# Patient Record
Sex: Female | Born: 1975 | Race: Black or African American | Hispanic: No | Marital: Single | Smoking: Current every day smoker
Health system: Southern US, Community
[De-identification: ages and names within clinical notes are randomized; demographics above are authoritative.]

## PROBLEM LIST (undated history)

## (undated) DIAGNOSIS — I10 Essential (primary) hypertension: Secondary | ICD-10-CM

## (undated) DIAGNOSIS — M1990 Unspecified osteoarthritis, unspecified site: Secondary | ICD-10-CM

## (undated) HISTORY — PX: BREAST SURGERY: SHX581

---

## 2009-08-01 ENCOUNTER — Other Ambulatory Visit: Admission: RE | Admit: 2009-08-01 | Discharge: 2009-08-01 | Payer: Self-pay | Admitting: Unknown Physician Specialty

## 2015-05-03 ENCOUNTER — Other Ambulatory Visit: Payer: Self-pay | Admitting: Physician Assistant

## 2015-05-10 ENCOUNTER — Encounter (HOSPITAL_COMMUNITY)
Admission: RE | Admit: 2015-05-10 | Discharge: 2015-05-10 | Disposition: A | Discharge status: Home / Self Care | Payer: PRIVATE HEALTH INSURANCE | Source: Ambulatory Visit | Attending: Orthopaedic Surgery | Admitting: Orthopaedic Surgery

## 2015-05-10 ENCOUNTER — Encounter (HOSPITAL_COMMUNITY): Payer: Self-pay

## 2015-05-10 DIAGNOSIS — I498 Other specified cardiac arrhythmias: Secondary | ICD-10-CM | POA: Insufficient documentation

## 2015-05-10 DIAGNOSIS — Z01812 Encounter for preprocedural laboratory examination: Secondary | ICD-10-CM | POA: Insufficient documentation

## 2015-05-10 DIAGNOSIS — Z01818 Encounter for other preprocedural examination: Secondary | ICD-10-CM | POA: Insufficient documentation

## 2015-05-10 HISTORY — DX: Essential (primary) hypertension: I10

## 2015-05-10 HISTORY — DX: Unspecified osteoarthritis, unspecified site: M19.90

## 2015-05-10 LAB — HCG, SERUM, QUALITATIVE: Preg, Serum: NEGATIVE

## 2015-05-10 LAB — BASIC METABOLIC PANEL
Anion gap: 13 (ref 5–15)
BUN: 7 mg/dL (ref 6–20)
CALCIUM: 9.7 mg/dL (ref 8.9–10.3)
CHLORIDE: 102 mmol/L (ref 101–111)
CO2: 23 mmol/L (ref 22–32)
CREATININE: 0.89 mg/dL (ref 0.44–1.00)
Glucose, Bld: 102 mg/dL — ABNORMAL HIGH (ref 65–99)
Potassium: 3.5 mmol/L (ref 3.5–5.1)
SODIUM: 138 mmol/L (ref 135–145)

## 2015-05-10 LAB — CBC
HCT: 43 % (ref 36.0–46.0)
HEMOGLOBIN: 14.8 g/dL (ref 12.0–15.0)
MCH: 31.7 pg (ref 26.0–34.0)
MCHC: 34.4 g/dL (ref 30.0–36.0)
MCV: 92.1 fL (ref 78.0–100.0)
PLATELETS: 216 10*3/uL (ref 150–400)
RBC: 4.67 MIL/uL (ref 3.87–5.11)
RDW: 12.9 % (ref 11.5–15.5)
WBC: 7.9 10*3/uL (ref 4.0–10.5)

## 2015-05-10 LAB — SURGICAL PCR SCREEN
MRSA, PCR: NEGATIVE
STAPHYLOCOCCUS AUREUS: NEGATIVE

## 2015-05-10 NOTE — Pre-Procedure Instructions (Addendum)
Avalin L Pasquarella  05/10/2015     No Pharmacies Listed   Your procedure is scheduled on Tuesday, May 16, 2015    [facility] "A" Admitting Office at 10:10 AM.   Call this number if you have problems the morning of surgery: [phone]   Any questions prior to day of surgery, please call [phone] between 8 & 4 PM.   Remember:  Do not eat food or drink liquids after midnight Monday, 05/15/15.  Take these medicines the morning of surgery with A SIP OF WATER:atenolol, pain med  STOP all herbel meds, nsaids (aleve,naproxen,advil,ibuprofen)TOday including aspirin, vitamins   Do not wear jewelry, make-up or nail polish.  Do not wear lotions, powders, or perfumes.  You may wear deodorant.  Do not shave 48 hours prior to surgery.    Do not bring valuables to the hospital.  [facility] is not responsible for any belongings or valuables.  Contacts, dentures or bridgework may not be worn into surgery.  Leave your suitcase in the car.  After surgery it may be brought to your room.  For patients admitted to the hospital, discharge time will be determined by your treatment team.  Special instructions:   Special Instructions: [place] - Preparing for Surgery  Before surgery, you can play an important role.  Because skin is not sterile, your skin needs to be as free of germs as possible.  You can reduce the number of germs on you skin by washing with CHG (chlorahexidine gluconate) soap before surgery.  CHG is an antiseptic cleaner which kills germs and bonds with the skin to continue killing germs even after washing.  Please DO NOT use if you have an allergy to CHG or antibacterial soaps.  If your skin becomes reddened/irritated stop using the CHG and inform your nurse when you arrive at Short Stay.  Do not shave (including legs and underarms) for at least 48 hours prior to the first CHG shower.  You may shave your face.  Please follow these instructions  carefully:   1.  Shower with CHG Soap the night before surgery and the morning of Surgery.  2.  If you choose to wash your hair, wash your hair first as usual with your normal shampoo.  3.  After you shampoo, rinse your hair and body thoroughly to remove the Shampoo.  4.  Use CHG as you would any other liquid soap.  You can apply chg directly  to the skin and wash gently with scrungie or a clean washcloth.  5.  Apply the CHG Soap to your body ONLY FROM THE NECK DOWN.  Do not use on open wounds or open sores.  Avoid contact with your eyes ears, mouth and genitals (private parts).  Wash genitals (private parts)       with your normal soap.  6.  Wash thoroughly, paying special attention to the area where your surgery will be performed.  7.  Thoroughly rinse your body with warm water from the neck down.  8.  DO NOT shower/wash with your normal soap after using and rinsing off the CHG Soap.  9.  Pat yourself dry with a clean towel.            10.  Wear clean pajamas.            11.  Place clean sheets on your bed the night of your first shower and do not sleep with pets.  Day of  Surgery  Do not apply any lotions/deodorants the morning of surgery.  Please wear clean clothes to the hospital/surgery center. Please read over the following fact sheets that you were given. Pain Booklet, Coughing and Deep Breathing, MRSA Information and Surgical Site Infection Prevention

## 2015-05-15 MED ORDER — CEFAZOLIN SODIUM-DEXTROSE 2-3 GM-% IV SOLR
2.0000 g | INTRAVENOUS | Status: AC
  Administered 2015-05-16: 2 g via INTRAVENOUS
  Filled 2015-05-15: qty 50

## 2015-05-15 MED ORDER — TRANEXAMIC ACID 1000 MG/10ML IV SOLN
1000.0000 mg | INTRAVENOUS | Status: AC
  Administered 2015-05-16: 1000 mg via INTRAVENOUS
  Filled 2015-05-15: qty 10

## 2015-05-16 ENCOUNTER — Inpatient Hospital Stay (HOSPITAL_COMMUNITY): Payer: PRIVATE HEALTH INSURANCE | Admitting: Anesthesiology

## 2015-05-16 ENCOUNTER — Encounter (HOSPITAL_COMMUNITY)
Admission: RE | Disposition: A | Discharge status: Home Health | Payer: Self-pay | Source: Ambulatory Visit | Attending: Orthopaedic Surgery

## 2015-05-16 ENCOUNTER — Inpatient Hospital Stay (HOSPITAL_COMMUNITY)
Admission: RE | Admit: 2015-05-16 | Discharge: 2015-05-18 | DRG: 470 | Disposition: A | Discharge status: Home Health | Payer: PRIVATE HEALTH INSURANCE | Source: Ambulatory Visit | Attending: Orthopaedic Surgery | Admitting: Orthopaedic Surgery

## 2015-05-16 ENCOUNTER — Inpatient Hospital Stay (HOSPITAL_COMMUNITY): Payer: PRIVATE HEALTH INSURANCE

## 2015-05-16 ENCOUNTER — Encounter (HOSPITAL_COMMUNITY): Payer: Self-pay | Admitting: Anesthesiology

## 2015-05-16 DIAGNOSIS — Z419 Encounter for procedure for purposes other than remedying health state, unspecified: Secondary | ICD-10-CM

## 2015-05-16 DIAGNOSIS — F17210 Nicotine dependence, cigarettes, uncomplicated: Secondary | ICD-10-CM | POA: Diagnosis present

## 2015-05-16 DIAGNOSIS — Z96641 Presence of right artificial hip joint: Secondary | ICD-10-CM

## 2015-05-16 DIAGNOSIS — M1611 Unilateral primary osteoarthritis, right hip: Secondary | ICD-10-CM | POA: Diagnosis present

## 2015-05-16 DIAGNOSIS — I10 Essential (primary) hypertension: Secondary | ICD-10-CM | POA: Diagnosis present

## 2015-05-16 DIAGNOSIS — M25551 Pain in right hip: Secondary | ICD-10-CM | POA: Diagnosis present

## 2015-05-16 HISTORY — PX: TOTAL HIP ARTHROPLASTY: SHX124

## 2015-05-16 SURGERY — ARTHROPLASTY, HIP, TOTAL, ANTERIOR APPROACH
Anesthesia: Spinal | Site: Hip | Laterality: Right

## 2015-05-16 MED ORDER — CEFAZOLIN SODIUM 1-5 GM-% IV SOLN
1.0000 g | Freq: Four times a day (QID) | INTRAVENOUS | Status: AC
  Administered 2015-05-16 (×2): 1 g via INTRAVENOUS
  Filled 2015-05-16 (×3): qty 50

## 2015-05-16 MED ORDER — LIDOCAINE HCL (CARDIAC) 20 MG/ML IV SOLN
INTRAVENOUS | Status: AC
  Filled 2015-05-16: qty 5

## 2015-05-16 MED ORDER — FENTANYL CITRATE (PF) 100 MCG/2ML IJ SOLN
INTRAMUSCULAR | Status: DC | PRN
  Administered 2015-05-16: 25 ug via INTRAVENOUS
  Administered 2015-05-16 (×2): 50 ug via INTRAVENOUS
  Administered 2015-05-16: 25 ug via INTRAVENOUS

## 2015-05-16 MED ORDER — METHOCARBAMOL 1000 MG/10ML IJ SOLN
500.0000 mg | Freq: Four times a day (QID) | INTRAMUSCULAR | Status: DC | PRN
  Filled 2015-05-16: qty 5

## 2015-05-16 MED ORDER — CHLORHEXIDINE GLUCONATE 4 % EX LIQD: 60.0000 mL | Freq: Once | CUTANEOUS | Status: DC

## 2015-05-16 MED ORDER — ALUM & MAG HYDROXIDE-SIMETH 200-200-20 MG/5ML PO SUSP: 30.0000 mL | ORAL | Status: DC | PRN

## 2015-05-16 MED ORDER — PHENOL 1.4 % MT LIQD: 1.0000 | OROMUCOSAL | Status: DC | PRN

## 2015-05-16 MED ORDER — HYDROMORPHONE HCL 1 MG/ML IJ SOLN
1.0000 mg | INTRAMUSCULAR | Status: DC | PRN
  Administered 2015-05-16 – 2015-05-18 (×8): 1 mg via INTRAVENOUS
  Filled 2015-05-16 (×8): qty 1

## 2015-05-16 MED ORDER — MIDAZOLAM HCL 2 MG/2ML IJ SOLN
INTRAMUSCULAR | Status: AC
  Filled 2015-05-16: qty 2

## 2015-05-16 MED ORDER — KETOROLAC TROMETHAMINE 15 MG/ML IJ SOLN
7.5000 mg | Freq: Four times a day (QID) | INTRAMUSCULAR | Status: AC
  Administered 2015-05-16 – 2015-05-17 (×4): 7.5 mg via INTRAVENOUS
  Filled 2015-05-16 (×3): qty 1

## 2015-05-16 MED ORDER — METHOCARBAMOL 500 MG PO TABS
ORAL_TABLET | ORAL | Status: AC
  Filled 2015-05-16: qty 1

## 2015-05-16 MED ORDER — KETOROLAC TROMETHAMINE 15 MG/ML IJ SOLN
INTRAMUSCULAR | Status: AC
  Filled 2015-05-16: qty 1

## 2015-05-16 MED ORDER — OXYCODONE HCL 5 MG PO TABS
ORAL_TABLET | ORAL | Status: AC
  Filled 2015-05-16: qty 2

## 2015-05-16 MED ORDER — MEPERIDINE HCL 25 MG/ML IJ SOLN: 6.2500 mg | INTRAMUSCULAR | Status: DC | PRN

## 2015-05-16 MED ORDER — SODIUM CHLORIDE 0.9 % IV SOLN
INTRAVENOUS | Status: DC
  Administered 2015-05-16 – 2015-05-18 (×3): via INTRAVENOUS

## 2015-05-16 MED ORDER — LIDOCAINE HCL (CARDIAC) 20 MG/ML IV SOLN
INTRAVENOUS | Status: DC | PRN
  Administered 2015-05-16: 50 mg via INTRAVENOUS

## 2015-05-16 MED ORDER — NICOTINE 21 MG/24HR TD PT24
21.0000 mg | MEDICATED_PATCH | Freq: Every day | TRANSDERMAL | Status: DC
  Administered 2015-05-16 – 2015-05-17 (×2): 21 mg via TRANSDERMAL
  Filled 2015-05-16 (×3): qty 1

## 2015-05-16 MED ORDER — ONDANSETRON HCL 4 MG/2ML IJ SOLN
INTRAMUSCULAR | Status: DC | PRN
  Administered 2015-05-16: 4 mg via INTRAVENOUS

## 2015-05-16 MED ORDER — 0.9 % SODIUM CHLORIDE (POUR BTL) OPTIME
TOPICAL | Status: DC | PRN
Start: 2015-05-16 — End: 2015-05-16
  Administered 2015-05-16: 1000 mL

## 2015-05-16 MED ORDER — HYDROMORPHONE HCL 1 MG/ML IJ SOLN
INTRAMUSCULAR | Status: AC
  Filled 2015-05-16: qty 1

## 2015-05-16 MED ORDER — ASPIRIN EC 325 MG PO TBEC
325.0000 mg | DELAYED_RELEASE_TABLET | Freq: Two times a day (BID) | ORAL | Status: DC
  Administered 2015-05-16 – 2015-05-18 (×4): 325 mg via ORAL
  Filled 2015-05-16 (×4): qty 1

## 2015-05-16 MED ORDER — METOCLOPRAMIDE HCL 10 MG PO TABS: 5.0000 mg | ORAL_TABLET | Freq: Three times a day (TID) | ORAL | Status: DC | PRN

## 2015-05-16 MED ORDER — PROMETHAZINE HCL 25 MG/ML IJ SOLN: 6.2500 mg | INTRAMUSCULAR | Status: DC | PRN

## 2015-05-16 MED ORDER — METOCLOPRAMIDE HCL 5 MG/ML IJ SOLN
5.0000 mg | Freq: Three times a day (TID) | INTRAMUSCULAR | Status: DC | PRN
Start: 2015-05-16 — End: 2015-05-18

## 2015-05-16 MED ORDER — PNEUMOCOCCAL VAC POLYVALENT 25 MCG/0.5ML IJ INJ
0.5000 mL | INJECTION | INTRAMUSCULAR | Status: AC
  Administered 2015-05-17: 0.5 mL via INTRAMUSCULAR
  Filled 2015-05-16: qty 1
  Filled 2015-05-16: qty 0.5

## 2015-05-16 MED ORDER — OXYCODONE HCL 5 MG PO TABS
5.0000 mg | ORAL_TABLET | ORAL | Status: DC | PRN
  Administered 2015-05-16: 5 mg via ORAL
  Administered 2015-05-16 – 2015-05-17 (×2): 10 mg via ORAL
  Administered 2015-05-17: 5 mg via ORAL
  Administered 2015-05-17 – 2015-05-18 (×2): 10 mg via ORAL
  Filled 2015-05-16: qty 1
  Filled 2015-05-16: qty 2
  Filled 2015-05-16: qty 1
  Filled 2015-05-16 (×2): qty 2

## 2015-05-16 MED ORDER — ONDANSETRON HCL 4 MG/2ML IJ SOLN
4.0000 mg | Freq: Four times a day (QID) | INTRAMUSCULAR | Status: DC | PRN
  Administered 2015-05-16 – 2015-05-17 (×4): 4 mg via INTRAVENOUS
  Filled 2015-05-16 (×4): qty 2

## 2015-05-16 MED ORDER — MIDAZOLAM HCL 5 MG/5ML IJ SOLN
INTRAMUSCULAR | Status: DC | PRN
  Administered 2015-05-16 (×2): 1 mg via INTRAVENOUS

## 2015-05-16 MED ORDER — ATENOLOL 50 MG PO TABS
25.0000 mg | ORAL_TABLET | Freq: Every day | ORAL | Status: DC
  Administered 2015-05-17 – 2015-05-18 (×2): 25 mg via ORAL
  Filled 2015-05-16 (×2): qty 1

## 2015-05-16 MED ORDER — ACETAMINOPHEN 650 MG RE SUPP: 650.0000 mg | Freq: Four times a day (QID) | RECTAL | Status: DC | PRN

## 2015-05-16 MED ORDER — HYDROMORPHONE HCL 1 MG/ML IJ SOLN
0.2500 mg | INTRAMUSCULAR | Status: DC | PRN
  Administered 2015-05-16 (×2): 0.5 mg via INTRAVENOUS

## 2015-05-16 MED ORDER — BUPIVACAINE HCL (PF) 0.5 % IJ SOLN
INTRAMUSCULAR | Status: DC | PRN
  Administered 2015-05-16: 3 mL via INTRATHECAL

## 2015-05-16 MED ORDER — ACETAMINOPHEN 325 MG PO TABS: 650.0000 mg | ORAL_TABLET | Freq: Four times a day (QID) | ORAL | Status: DC | PRN

## 2015-05-16 MED ORDER — METHOCARBAMOL 500 MG PO TABS
500.0000 mg | ORAL_TABLET | Freq: Four times a day (QID) | ORAL | Status: DC | PRN
  Administered 2015-05-16 – 2015-05-17 (×2): 500 mg via ORAL
  Filled 2015-05-16: qty 1

## 2015-05-16 MED ORDER — LACTATED RINGERS IV SOLN
INTRAVENOUS | Status: DC
  Administered 2015-05-16 (×2): via INTRAVENOUS

## 2015-05-16 MED ORDER — ONDANSETRON HCL 4 MG PO TABS: 4.0000 mg | ORAL_TABLET | Freq: Four times a day (QID) | ORAL | Status: DC | PRN

## 2015-05-16 MED ORDER — FENTANYL CITRATE (PF) 250 MCG/5ML IJ SOLN
INTRAMUSCULAR | Status: AC
  Filled 2015-05-16: qty 5

## 2015-05-16 MED ORDER — INFLUENZA VAC SPLIT QUAD 0.5 ML IM SUSY
0.5000 mL | PREFILLED_SYRINGE | INTRAMUSCULAR | Status: AC
  Administered 2015-05-17: 0.5 mL via INTRAMUSCULAR
  Filled 2015-05-16: qty 0.5

## 2015-05-16 MED ORDER — DIPHENHYDRAMINE HCL 12.5 MG/5ML PO ELIX
12.5000 mg | ORAL_SOLUTION | ORAL | Status: DC | PRN
  Filled 2015-05-16: qty 10

## 2015-05-16 MED ORDER — MENTHOL 3 MG MT LOZG: 1.0000 | LOZENGE | OROMUCOSAL | Status: DC | PRN

## 2015-05-16 MED ORDER — ONDANSETRON HCL 4 MG/2ML IJ SOLN
INTRAMUSCULAR | Status: AC
  Filled 2015-05-16: qty 2

## 2015-05-16 MED ORDER — PROPOFOL 500 MG/50ML IV EMUL
INTRAVENOUS | Status: DC | PRN
  Administered 2015-05-16: 50 ug/kg/min via INTRAVENOUS

## 2015-05-16 MED ORDER — ZOLPIDEM TARTRATE 5 MG PO TABS: 5.0000 mg | ORAL_TABLET | Freq: Every evening | ORAL | Status: DC | PRN

## 2015-05-16 MED ORDER — HYDROCHLOROTHIAZIDE 25 MG PO TABS
25.0000 mg | ORAL_TABLET | Freq: Every day | ORAL | Status: DC
  Administered 2015-05-16 – 2015-05-18 (×3): 25 mg via ORAL
  Filled 2015-05-16 (×3): qty 1

## 2015-05-16 MED ORDER — DOCUSATE SODIUM 100 MG PO CAPS
100.0000 mg | ORAL_CAPSULE | Freq: Two times a day (BID) | ORAL | Status: DC
  Administered 2015-05-16 – 2015-05-18 (×4): 100 mg via ORAL
  Filled 2015-05-16 (×5): qty 1

## 2015-05-16 SURGICAL SUPPLY — 52 items
BENZOIN TINCTURE PRP APPL 2/3 (GAUZE/BANDAGES/DRESSINGS) ×3 IMPLANT
BLADE SAW SGTL 18X1.27X75 (BLADE) ×2 IMPLANT
BLADE SAW SGTL 18X1.27X75MM (BLADE) ×1
BLADE SURG ROTATE 9660 (MISCELLANEOUS) IMPLANT
CAPT HIP TOTAL 2 ×3 IMPLANT
CELLS DAT CNTRL 66122 CELL SVR (MISCELLANEOUS) ×1 IMPLANT
CLOSURE STERI-STRIP 1/2X4 (GAUZE/BANDAGES/DRESSINGS) ×1
CLOSURE WOUND 1/2 X4 (GAUZE/BANDAGES/DRESSINGS) ×2
CLSR STERI-STRIP ANTIMIC 1/2X4 (GAUZE/BANDAGES/DRESSINGS) ×2 IMPLANT
COVER SURGICAL LIGHT HANDLE (MISCELLANEOUS) ×3 IMPLANT
DRAPE C-ARM 42X72 X-RAY (DRAPES) ×3 IMPLANT
DRAPE STERI IOBAN 125X83 (DRAPES) ×3 IMPLANT
DRAPE U-SHAPE 47X51 STRL (DRAPES) ×9 IMPLANT
DRSG AQUACEL AG ADV 3.5X10 (GAUZE/BANDAGES/DRESSINGS) ×3 IMPLANT
DURAPREP 26ML APPLICATOR (WOUND CARE) ×3 IMPLANT
ELECT BLADE 4.0 EZ CLEAN MEGAD (MISCELLANEOUS) ×3
ELECT BLADE 6.5 EXT (BLADE) IMPLANT
ELECT REM PT RETURN 9FT ADLT (ELECTROSURGICAL) ×3
ELECTRODE BLDE 4.0 EZ CLN MEGD (MISCELLANEOUS) ×1 IMPLANT
ELECTRODE REM PT RTRN 9FT ADLT (ELECTROSURGICAL) ×1 IMPLANT
FACESHIELD WRAPAROUND (MASK) ×6 IMPLANT
GLOVE BIOGEL PI IND STRL 8 (GLOVE) ×2 IMPLANT
GLOVE BIOGEL PI INDICATOR 8 (GLOVE) ×4
GLOVE ECLIPSE 8.0 STRL XLNG CF (GLOVE) ×3 IMPLANT
GLOVE ORTHO TXT STRL SZ7.5 (GLOVE) ×6 IMPLANT
GOWN STRL REUS W/ TWL LRG LVL3 (GOWN DISPOSABLE) ×2 IMPLANT
GOWN STRL REUS W/ TWL XL LVL3 (GOWN DISPOSABLE) ×2 IMPLANT
GOWN STRL REUS W/TWL LRG LVL3 (GOWN DISPOSABLE) ×4
GOWN STRL REUS W/TWL XL LVL3 (GOWN DISPOSABLE) ×4
HANDPIECE INTERPULSE COAX TIP (DISPOSABLE) ×2
KIT BASIN OR (CUSTOM PROCEDURE TRAY) ×3 IMPLANT
KIT ROOM TURNOVER OR (KITS) ×3 IMPLANT
MANIFOLD NEPTUNE II (INSTRUMENTS) ×3 IMPLANT
NS IRRIG 1000ML POUR BTL (IV SOLUTION) ×3 IMPLANT
PACK TOTAL JOINT (CUSTOM PROCEDURE TRAY) ×3 IMPLANT
PAD ARMBOARD 7.5X6 YLW CONV (MISCELLANEOUS) ×3 IMPLANT
RTRCTR WOUND ALEXIS 18CM MED (MISCELLANEOUS) ×3
SET HNDPC FAN SPRY TIP SCT (DISPOSABLE) ×1 IMPLANT
STAPLER VISISTAT 35W (STAPLE) IMPLANT
STRIP CLOSURE SKIN 1/2X4 (GAUZE/BANDAGES/DRESSINGS) ×4 IMPLANT
SUT ETHIBOND NAB CT1 #1 30IN (SUTURE) ×3 IMPLANT
SUT MNCRL AB 4-0 PS2 18 (SUTURE) IMPLANT
SUT VIC AB 0 CT1 27 (SUTURE) ×2
SUT VIC AB 0 CT1 27XBRD ANBCTR (SUTURE) ×1 IMPLANT
SUT VIC AB 1 CT1 27 (SUTURE) ×2
SUT VIC AB 1 CT1 27XBRD ANBCTR (SUTURE) ×1 IMPLANT
SUT VIC AB 2-0 CT1 27 (SUTURE) ×2
SUT VIC AB 2-0 CT1 TAPERPNT 27 (SUTURE) ×1 IMPLANT
TOWEL OR 17X24 6PK STRL BLUE (TOWEL DISPOSABLE) ×3 IMPLANT
TOWEL OR 17X26 10 PK STRL BLUE (TOWEL DISPOSABLE) ×3 IMPLANT
TRAY FOLEY CATH 16FRSI W/METER (SET/KITS/TRAYS/PACK) IMPLANT
WATER STERILE IRR 1000ML POUR (IV SOLUTION) ×6 IMPLANT

## 2015-05-16 NOTE — Anesthesia Postprocedure Evaluation (Signed)
Anesthesia Post Note  Patient: [name]  Procedure(s) Performed: Procedure(s) (LRB): RIGHT TOTAL HIP ARTHROPLASTY ANTERIOR APPROACH (Right)  Patient location during evaluation: PACU Anesthesia Type: Spinal and MAC Level of consciousness: awake and alert Pain management: pain level controlled Vital Signs Assessment: post-procedure vital signs reviewed and stable Respiratory status: spontaneous breathing and respiratory function stable Cardiovascular status: blood pressure returned to baseline and stable Postop Assessment: spinal receding Anesthetic complications: no    Last Vitals:  Filed Vitals:   05/16/15 1415 05/16/15 1445  BP: 120/79 123/80  Pulse: 61 67  Temp:    Resp: 13 17    Last Pain:  Filed Vitals:   05/16/15 1454  PainSc: 4                  Elene Downum [employer]

## 2015-05-16 NOTE — Transfer of Care (Signed)
Immediate Anesthesia Transfer of Care Note  Patient: [name]  Procedure(s) Performed: Procedure(s): RIGHT TOTAL HIP ARTHROPLASTY ANTERIOR APPROACH (Right)  Patient Location: PACU  Anesthesia Type:Spinal  Level of Consciousness: awake, alert , oriented and patient cooperative  Airway & Oxygen Therapy: Patient Spontanous Breathing and Patient connected to nasal cannula oxygen  Post-op Assessment: Report given to RN and Post -op Vital signs reviewed and stable  Post vital signs: Reviewed and stable  Last Vitals:  Filed Vitals:   05/16/15 0839  BP: 125/81  Pulse: 80  Temp: 36.7 C  Resp: 20    Complications: No apparent anesthesia complications

## 2015-05-16 NOTE — Progress Notes (Signed)
NURSING PROGRESS NOTE  Courtney CabotShuwana L Silva [id number] Admission Data: 05/16/2015 4:57 PM Attending Provider: Kathryne Hitchhristopher Y Blackman, * WUJ:WJXB,JYNWGNFAPCP:MUSE,ROCHELLE D., PA-C Code Status: FULL  [name] is a 40 y.o. female patient admitted from PACU:  -No acute distress noted.  -No complaints of shortness of breath.  -No complaints of chest pain.   Cardiac Monitoring: N/A  Blood pressure 130/79, pulse 73, temperature 97.5 F (36.4 C), temperature source Oral, resp. rate 20, height 5\' 3"  (1.6 m), weight 87.544 kg (193 lb), last menstrual period 04/18/2015, SpO2 95 %.   IV Fluids:  IV in place, occlusive dsg intact without redness, IV cath hand left, condition patent and no redness normal saline.   Allergies:  Review of patient's allergies indicates no known allergies.  Past Medical History:   has a past medical history of Hypertension and Arthritis.  Past Surgical History:   has past surgical history that includes Breast surgery (2014,2009).  Social History:   reports that she has been smoking Cigarettes.  She has a 10 pack-year smoking history. She does not have any smokeless tobacco history on file. She reports that she does not drink alcohol or use illicit drugs.  Skin: Surgical incision to right hip is clean, dry & intact.   Patient/Family orientated to room. Information packet given to patient/family. Admission inpatient armband information verified with patient/family to include name and date of birth and placed on patient arm. Side rails up x 2, fall assessment and education completed with patient/family. Patient/family able to verbalize understanding of risk associated with falls and verbalized understanding to call for assistance before getting out of bed. Call light within reach. Patient/family able to voice and demonstrate understanding of unit orientation instructions.    Will continue to evaluate and treat per MD orders.  Bennie Pieriniyndi Christalynn Boise, RN

## 2015-05-16 NOTE — Anesthesia Procedure Notes (Signed)
Spinal Patient location during procedure: OR Staffing Anesthesiologist: Alexander Mcauley Performed by: anesthesiologist  Preanesthetic Checklist Completed: patient identified, site marked, surgical consent, pre-op evaluation, timeout performed, IV checked, risks and benefits discussed and monitors and equipment checked Spinal Block Patient position: sitting Prep: ChloraPrep Patient monitoring: heart rate, continuous pulse ox and blood pressure Approach: midline Location: L2-3 Injection technique: single-shot Needle Needle type: Sprotte  Needle gauge: 24 G Needle length: 9 cm Additional Notes Expiration date of kit checked and confirmed. Patient tolerated procedure well, without complications.     

## 2015-05-16 NOTE — Progress Notes (Signed)
Lunch relief by M. Brande RN 

## 2015-05-16 NOTE — Anesthesia Preprocedure Evaluation (Addendum)
Anesthesia Evaluation  Patient identified by MRN, date of birth, ID band Patient awake    Reviewed: Allergy & Precautions, NPO status , Patient's Chart, lab work & pertinent test results  Airway Mallampati: II  TM Distance: >3 FB Neck ROM: Full    Dental no notable dental hx.    Pulmonary Current Smoker,    Pulmonary exam normal breath sounds clear to auscultation       Cardiovascular hypertension, Pt. on medications Normal cardiovascular exam Rhythm:Regular Rate:Normal     Neuro/Psych negative neurological ROS  negative psych ROS   GI/Hepatic negative GI ROS, Neg liver ROS,   Endo/Other  negative endocrine ROS  Renal/GU negative Renal ROS     Musculoskeletal  (+) Arthritis ,   Abdominal   Peds  Hematology negative hematology ROS (+)   Anesthesia Other Findings   Reproductive/Obstetrics negative OB ROS                           Anesthesia Physical Anesthesia Plan  ASA: II  Anesthesia Plan: Spinal   Post-op Pain Management:    Induction: Intravenous  Airway Management Planned:   Additional Equipment:   Intra-op Plan:   Post-operative Plan:   Informed Consent: I have reviewed the patients History and Physical, chart, labs and discussed the procedure including the risks, benefits and alternatives for the proposed anesthesia with the patient or authorized representative who has indicated his/her understanding and acceptance.   Dental advisory given  Plan Discussed with: CRNA  Anesthesia Plan Comments:        Anesthesia Quick Evaluation

## 2015-05-16 NOTE — Brief Op Note (Signed)
05/16/2015  12:14 PM  PATIENT:  [name]  40 y.o. 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   Delay start of Pharmacological VTE agent (>24hrs) due to surgical blood loss or risk of bleeding: no

## 2015-05-16 NOTE — H&P (Signed)
TOTAL HIP ADMISSION H&P  Patient is admitted for left total hip arthroplasty.  Subjective:  Chief Complaint: left hip pain  HPI: Franki CabotShuwana L Diesing, 40 y.o. female, has a history of pain and functional disability in the left hip(s) due to trauma and arthritis and patient has failed non-surgical conservative treatments for greater than 12 weeks to include NSAID's and/or analgesics, use of assistive devices and activity modification.  Onset of symptoms was gradual starting 7 years ago with gradually worsening course since that time.The patient noted no past surgery on the right hip(s).  Patient currently rates pain in the right hip at 10 out of 10 with activity. Patient has night pain, worsening of pain with activity and weight bearing, trendelenberg gait, pain that interfers with activities of daily living, pain with passive range of motion and crepitus. Patient has evidence of subchondral sclerosis, periarticular osteophytes and joint space narrowing by imaging studies. This condition presents safety issues increasing the risk of falls.  There is no current active infection.  Patient Active Problem List   Diagnosis Date Noted  . Osteoarthritis of right hip 05/16/2015   Past Medical History  Diagnosis Date  . Hypertension   . Arthritis     Past Surgical History  Procedure Laterality Date  . Breast surgery  2014,2009    rt breast abscess(s)    No prescriptions prior to admission   Not on File  Social History  Substance Use Topics  . Smoking status: Current Every Day Smoker -- 0.50 packs/day for 20 years    Types: Cigarettes  . Smokeless tobacco: Not on file  . Alcohol Use: No    No family history on file.   Review of Systems  Musculoskeletal: Positive for joint pain.  All other systems reviewed and are negative.   Objective:  Physical Exam  Constitutional: She is oriented to person, place, and time. She appears well-developed and well-nourished.  HENT:  Head: Normocephalic  and atraumatic.  Eyes: EOM are normal. Pupils are equal, round, and reactive to light.  Neck: Normal range of motion. Neck supple.  Cardiovascular: Normal rate and regular rhythm.   Respiratory: Effort normal and breath sounds normal.  GI: Soft. Bowel sounds are normal.  Musculoskeletal:       Right hip: She exhibits decreased range of motion, decreased strength, tenderness and bony tenderness.  Neurological: She is alert and oriented to person, place, and time.  Skin: Skin is warm and dry.  Psychiatric: She has a normal mood and affect.    Vital signs in last 24 hours:    Labs:   There is no height or weight on file to calculate BMI.   Imaging Review Plain radiographs demonstrate severe degenerative joint disease of the right hip(s). The bone quality appears to be excellent for age and reported activity level.  Assessment/Plan:  End stage arthritis, right hip(s)  The patient history, physical examination, clinical judgement of the provider and imaging studies are consistent with end stage degenerative joint disease of the right hip(s) and total hip arthroplasty is deemed medically necessary. The treatment options including medical management, injection therapy, arthroscopy and arthroplasty were discussed at length. The risks and benefits of total hip arthroplasty were presented and reviewed. The risks due to aseptic loosening, infection, stiffness, dislocation/subluxation,  thromboembolic complications and other imponderables were discussed.  The patient acknowledged the explanation, agreed to proceed with the plan and consent was signed. Patient is being admitted for inpatient treatment for surgery, pain control, PT, OT, prophylactic  antibiotics, VTE prophylaxis, progressive ambulation and ADL's and discharge planning.The patient is planning to be discharged home with home health services

## 2015-05-17 ENCOUNTER — Encounter (HOSPITAL_COMMUNITY): Payer: Self-pay | Admitting: Orthopaedic Surgery

## 2015-05-17 LAB — BASIC METABOLIC PANEL
Anion gap: 9 (ref 5–15)
CHLORIDE: 100 mmol/L — AB (ref 101–111)
CO2: 25 mmol/L (ref 22–32)
CREATININE: 0.68 mg/dL (ref 0.44–1.00)
Calcium: 8.3 mg/dL — ABNORMAL LOW (ref 8.9–10.3)
GFR calc Af Amer: >60 mL/min (ref >60)
GFR calc non Af Amer: >60 mL/min (ref >60)
Glucose, Bld: 116 mg/dL — ABNORMAL HIGH (ref 65–99)
Potassium: 2.7 mmol/L — CL (ref 3.5–5.1)
SODIUM: 134 mmol/L — AB (ref 135–145)

## 2015-05-17 LAB — CBC
HCT: 33.9 % — ABNORMAL LOW (ref 36.0–46.0)
HEMOGLOBIN: 11.8 g/dL — AB (ref 12.0–15.0)
MCH: 31.5 pg (ref 26.0–34.0)
MCHC: 34.8 g/dL (ref 30.0–36.0)
MCV: 90.4 fL (ref 78.0–100.0)
Platelets: 173 10*3/uL (ref 150–400)
RBC: 3.75 MIL/uL — ABNORMAL LOW (ref 3.87–5.11)
RDW: 12.7 % (ref 11.5–15.5)
WBC: 6.5 10*3/uL (ref 4.0–10.5)

## 2015-05-17 MED ORDER — POTASSIUM CHLORIDE CRYS ER 20 MEQ PO TBCR
40.0000 meq | EXTENDED_RELEASE_TABLET | Freq: Two times a day (BID) | ORAL | Status: AC
  Administered 2015-05-17 (×2): 40 meq via ORAL
  Filled 2015-05-17 (×2): qty 2

## 2015-05-17 NOTE — Evaluation (Signed)
Physical Therapy Evaluation Patient Details Name: Courtney CabotShuwana L Armstead MRN: [id number] DOB: 12/09/1975 Today's Date: 05/17/2015   History of Present Illness  Aneka L Glasco, 40 y.o. female, has a history of pain and functional disability in the right hip. Pt s/p Right THA. PMH: HTN, arthritis   Clinical Impression  Pt was given a hand out of exercises and started some instructions, with pt noting significant edema R thigh.  Pt is aware of safety and is high level enough to plan for home but needs to be encouraged not to overdo her mobility and exercises.  Very motivated but may not be aware of her need to set limits.  Family in to observe first session and will need to take pt on stairs next visit if possible for home, hopefully with family then.    Follow Up Recommendations Home health PT;Supervision - Intermittent    Equipment Recommendations  Rolling walker with 5" wheels    Recommendations for Other Services       Precautions / Restrictions Precautions Precautions: Fall Restrictions Weight Bearing Restrictions: No RLE Weight Bearing: Weight bearing as tolerated      Mobility  Bed Mobility Overal bed mobility: Needs Assistance Bed Mobility: Supine to Sit     Supine to sit: Min guard;Supervision     General bed mobility comments: Pt able to manage RLE independently. Supervision for safety.   Transfers Overall transfer level: Needs assistance Equipment used: Rolling walker (2 wheeled) Transfers: Sit to/from [employer];Stand Pivot Transfers Sit to Stand: Min guard Stand pivot transfers: Min guard          Ambulation/Gait Ambulation/Gait assistance: Min guard Ambulation Distance (Feet): 120 Feet Assistive device: Rolling walker (2 wheeled) Gait Pattern/deviations: Step-through pattern;Decreased stance time - right;Narrow base of support;Antalgic Gait velocity: redcued Gait velocity interpretation: Below normal speed for age/gender General Gait Details: Pt used RW  effectively to maintain controlled pressure and verbally cued her for determining how close to step to walker with gait  Stairs            Wheelchair Mobility    Modified Rankin (Stroke Patients Only)       Balance Overall balance assessment: Needs assistance Sitting-balance support: Feet supported Sitting balance-Leahy Scale: Good     Standing balance support: Bilateral upper extremity supported Standing balance-Leahy Scale: Fair                               Pertinent Vitals/Pain Pain Assessment: Faces Pain Score: 4  Faces Pain Scale: Hurts little more Pain Location: R hip Pain Descriptors / Indicators: Aching;Operative site guarding Pain Intervention(s): Monitored during session;Premedicated before session;Repositioned;Ice applied    Home Living Family/patient expects to be discharged to:: Private residence Living Arrangements: Other relatives Available Help at Discharge: Family;Available 24 hours/day Type of Home: Mobile home Home Access: Stairs to enter Entrance Stairs-Rails: Right;Left;Can reach both Entrance Stairs-Number of Steps: 2 Home Layout: One level Home Equipment: None Additional Comments: aunt and cousin to assist and were in attendance to her PT visit    Prior Function Level of Independence: Independent               Hand Dominance   Dominant Hand: Right    Extremity/Trunk Assessment   Upper Extremity Assessment: Overall WFL for tasks assessed           Lower Extremity Assessment: RLE deficits/detail RLE Deficits / Details: edema and abraded skin R thigh, weakness and pain  with movement    Cervical / Trunk Assessment: Normal  Communication   Communication: No difficulties  Cognition Arousal/Alertness: Awake/alert Behavior During Therapy: WFL for tasks assessed/performed Overall Cognitive Status: Within Functional Limits for tasks assessed                      General Comments General comments (skin  integrity, edema, etc.): Pt is able to get up to walk with minor help from her bed and with RW was safely able to maneuver with vc's for sequence and balance on walker    Exercises Total Joint Exercises Ankle Circles/Pumps: AROM;Both;5 reps Quad Sets: AROM;Both;10 reps Gluteal Sets: AROM;Both;10 reps Heel Slides: AROM;Both;10 reps Hip ABduction/ADduction: AROM;Both;10 reps      Assessment/Plan    PT Assessment Patient needs continued PT services  PT Diagnosis Difficulty walking;Acute pain   PT Problem List Decreased strength;Decreased range of motion;Decreased activity tolerance;Decreased balance;Decreased mobility;Decreased coordination;Decreased knowledge of use of DME;Decreased knowledge of precautions;Decreased skin integrity;Pain  PT Treatment Interventions DME instruction;Gait training;Stair training;Functional mobility training;Therapeutic activities;Therapeutic exercise;Balance training;Neuromuscular re-education;Patient/family education   PT Goals (Current goals can be found in the Care Plan section) Acute Rehab PT Goals Patient Stated Goal: go home PT Goal Formulation: With patient/family Time For Goal Achievement: 05/31/15 Potential to Achieve Goals: Good    Frequency 7X/week   Barriers to discharge Inaccessible home environment will need to practice stairs    Co-evaluation               End of Session Equipment Utilized During Treatment: Gait belt Activity Tolerance: Patient tolerated treatment well;Patient limited by fatigue Patient left: with call bell/phone within reach;in chair;with family/visitor present Nurse Communication: Mobility status         Time: 1610-9604 PT Time Calculation (min) (ACUTE ONLY): 27 min   Charges:   PT Evaluation $PT Eval Low Complexity: 1 Procedure PT Treatments $Gait Training: 8-22 mins   PT G CodesIvar Drape 06/06/15, 7:36 PM   Samul Dada, PT MS Acute Rehab Dept. Number: [facility] [id number] and [facility]  [phone]

## 2015-05-17 NOTE — Progress Notes (Signed)
CRITICAL VALUE ALERT  Critical value received:  Potassium 2.7  Date of notification:  05/17/15  Time of notification:  0750  Critical value read back:Yes.    Nurse who received alert:  Bennie Pieriniyndi Anjoli Diemer, RN  MD notified (1st page):  Dr Maureen Ralphs. Blackman  Time of first page:  [phone]  MD notified (2nd page):  Time of second page:  Responding MD:  Dr Maureen Ralphs Blackman  Time MD responded:  [phone]  Dr Maureen Ralphs Blackman ordered K Dur 40MEQ BID today only. Placing VO now. Will administer as soon as available.

## 2015-05-17 NOTE — Op Note (Signed)
NAMArcelia Silva:  [name], [name]             ACCOUNT NO.:  [id number]  MEDICAL RECORD NO.:  [id number]  LOCATION:  [id number]                        FACILITY:  [facility]  PHYSICIAN:  Vanita PandaChristopher Y. Magnus IvanBlackman, M.D.DATE OF BIRTH:  1975-10-24  DATE OF PROCEDURE:  05/16/2015 DATE OF DISCHARGE:                              OPERATIVE REPORT   PREOPERATIVE DIAGNOSIS:  Posttraumatic osteoarthritis and degenerative joint disease, right hip.  POSTOPERATIVE DIAGNOSIS:  Posttraumatic osteoarthritis and degenerative joint disease, right hip.  PROCEDURE:  Right total hip arthroplasty through direct anterior approach.  IMPLANTS:  DePuy Sector Gription acetabular component size 52, size 36+ 0 neutral polyethylene liner, size 11 Corail femoral component with standard offset, size 36+ 1.5 ceramic hip ball.  SURGEON:  Vanita PandaChristopher Y. Magnus IvanBlackman, M.D.  ASSISTANT:  Richardean CanalGilbert Clark, PA-C.  ANESTHESIA:  Spinal.  ANTIBIOTICS:  2 g IV Ancef.  BLOOD LOSS:  150-200 mL.  COMPLICATIONS:  None.  INDICATIONS:  Ms. [name] is a 40 year old female, who 10 years ago, sustained a right hip dislocation and a trauma.  This was reduced, and over time, she has developed severe posttraumatic arthritis of that right hip, her right leg is now shortened as well significantly, her pain is daily, it is detrimental effect to her activities of daily living, her mobility, and her quality of life.  She was sent to me from vocational rehab, and she is a very motivated individual to get back to work and contributing to society, but her hip pain is severely debilitating and limited all the things that she can do.  She desires a right total hip arthroplasty to allow her to get back to a more normal life.  She understands our goals are decreased pain, improved mobility, and overall improved quality of life.  She understands the risk of acute blood loss anemia, nerve and vessel injury, fracture, infection, dislocation, DVT, and osteolysis due to  her young age with the components eventually wearing out of her long period of time.  PROCEDURE DESCRIPTION:  After informed consent was obtained, appropriate right hip was marked.  She was brought to the operating room where spinal anesthesia was obtained while she was on her stretcher.  She was then laid in supine position on the stretcher.  Traction boots were placed on both her feet.  Next, she was placed supine on the [facility] with the perineal post in place and both legs in inline with skeletal traction devices, but no traction applied.  Her right operative hip was then prepped and draped with DuraPrep and sterile drapes.  Of note, her leg was significantly short preoperative.  After prepping with DuraPrep and sterile drapes, time-out was called to identify correct patient, correct right hip.  We then made incision inferior and posterior to the anterior-superior iliac spine and carried this obliquely down the leg.  We dissected down the tensor fascia lata muscle and tensor fascia was then divided longitudinally to proceed with a direct anterior approach to the hip.  We identified and cauterized the lateral femoral circumflex vessels and then identified the hip capsule. I opened up the hip capsule and found significant osteoarthritis of the femoral head and neck.  We used oscillating saw to cut our  femoral neck proximal to the lesser trochanter and completed this on osteotome and placed a corkscrew guide in the femoral head and removed the femoral head in its entirety.  We found it to be devoid of cartilage.  We then passed this off the table.  We cleaned the acetabular remnants of the acetabular labrum and placed a Bent Hohmann over the medial acetabular rim and began reaming under direct visualization from a size 42 all the way up to a 52 with the last reamer also under direct fluoroscopy, so we could obtain our depth of reaming, our inclination, and anteversion. Once  we were pleased with this, we placed the real DePuy Sector Gription acetabular component size 52, a single screw, and then a 36+ 0 neutral polyethylene liner for that size acetabular component.  Attention was then turned to the femur.  With the leg externally rotated to 110 degrees extended and adducted, we were able to place a Mueller retractor medially and a Hohmann retractor behind the greater trochanter, released lateral joint capsule and used a box cutting osteotome to enter femoral canal and a rongeur to lateralize.  We then began broaching from a size 8 broach all the way up to a size 11.  With the size 11, we trialed a standard offset femoral neck and a 36+ 1.5 hip ball, we rolled leg back over and up with traction and internal rotation reducing the pelvis.  We were pleased with leg length, offset, and range of motion.  We then dislocated the hip and removed the trial components.  We were able to place the real Corail femoral component size 11 with standard offset and the real 36+ 1.5 ceramic hip ball reducing the pelvis and we were pleased once again.  We then irrigated the soft tissues with normal saline solution after reducing the hip.  We closed the joint capsule with interrupted #1 Ethibond suture followed by a running #1 Vicryl in the tensor fascia, 0 Vicryl in the deep tissue, 2-0 Vicryl in the subcutaneous tissue, 4-0 Monocryl subcuticular stitch, and Steri-Strips on the skin.  An Aquacel dressing was applied.  She was taken off the Hana table and taken to recovery room in stable condition.  All final counts were correct.  There were no complications noted.  Of note, Richardean Canal, PA-C, assisted in the entire case.  His assistance was crucial for facilitating all aspects of this case.     Vanita Panda. Magnus Ivan, M.D.     CYB/MEDQ  D:  05/16/2015  T:  05/17/2015  Job:  [id number]

## 2015-05-17 NOTE — Progress Notes (Signed)
PT Cancellation Note  Patient Details Name: [name] MRN: [id number] DOB: 06/18/1975   Cancelled Treatment:    Reason Eval/Treat Not Completed: Pain limiting ability to participate (Asked PT to come back after meds).  WIll try later today as requested.   Ivar DrapeStout, Tempie Gibeault E 05/17/2015, 11:02 AM   Samul Dadauth Sequita Wise, PT MS Acute Rehab Dept. Number: [facility] [phone] and [facility] [phone]

## 2015-05-17 NOTE — Care Management Note (Signed)
Case Management Note  Patient Details  Name: Franki CabotShuwana L Mori MRN: [id number] Date of Birth: 12/16/1975  Subjective/Objective:                 Patient admitted with R hip ORIF. [facility]. Referrals made.   Action/Plan:  DC to home w/ HH PT when cleared by MD.  Expected Discharge Date:                  Expected Discharge Plan:  Home w Home Health Services  In-House Referral:     Discharge planning Services  CM Consult  Post Acute Care Choice:  Home Health, Durable Medical Equipment Choice offered to:  Patient  DME Arranged:  Walker rolling DME Agency:  [facility]  HH Arr[facility]  [facility]  Status of Service:  Completed, signed off  Medicare Important Message Given:    Date Medicare IM Given:    Medicare IM give by:    Date Additional Medicare IM Given:    Additional Medicare Important Message give by:     If discussed at Long Length of Stay Meetings, dates discussed:    Additional Comments:  Lawerance SabalDebbie Ashrith Sagan, RN 05/17/2015, 11:32 AM

## 2015-05-17 NOTE — Evaluation (Signed)
Occupational Therapy Evaluation Patient Details Name: [name] MRN: [id number] DOB: [birth year] Today's Date: 05/17/2015    History of Present Illness Dashauna L Bodie, 40 y.o. female, has a history of pain and functional disability in the right hip. Pt s/p Right THA. PMH: HTN, arthritis    Clinical Impression   Patient presenting with decreased ADL and functional mobility independence secondary to above. Patient independent PTA. Patient currently functioning at an overall supervision to min assist level, requiring up to max assist for LB ADLs. Patient will benefit from acute OT to increase overall independence in the areas of ADLs, functional mobility, and overall safety in order to safely discharge home with assistance from family.     Follow Up Recommendations  No OT follow up;Supervision/Assistance - 24 hour    Equipment Recommendations  3 in 1 bedside comode;Other (comment) (AE - reacher, sock aid, LH sponge, LH shoe horn)    Recommendations for Other Services  None at this time    Precautions / Restrictions Precautions Precautions: Fall Restrictions Weight Bearing Restrictions: Yes RLE Weight Bearing: Weight bearing as tolerated    Mobility Bed Mobility Overal bed mobility: Needs Assistance Bed Mobility: Supine to Sit     Supine to sit: Supervision     General bed mobility comments: Pt able to manage RLE independently. Supervision for safety.   Transfers Overall transfer level: Needs assistance Equipment used: Rolling walker (2 wheeled);None Transfers: Sit to/from Stand Sit to Stand: Supervision  General transfer comment: Pt initially stood without RW and supervision. Encouraged pt to use RW to assist with pain management and overall safety.     Balance Overall balance assessment: Needs assistance Sitting-balance support: No upper extremity supported;Feet supported Sitting balance-Leahy Scale: Good     Standing balance support: Bilateral upper  extremity supported;During functional activity Standing balance-Leahy Scale: Fair Standing balance comment: Pt able to stand at sink, using BUE for brushing of teeth. Pt did require occassional min guard assist for safety.     ADL Overall ADL's : Needs assistance/impaired Eating/Feeding: Set up;Sitting   Grooming: Supervision/safety;Standing;Min guard   Upper Body Bathing: Set up;Sitting   Lower Body Bathing: Moderate assistance;Sit to/from stand   Upper Body Dressing : Set up;Sitting   Lower Body Dressing: Maximal assistance;Sit to/from stand   Toilet Transfer: Min guard;RW;Comfort height toilet;Ambulation   Toileting- Clothing Manipulation and Hygiene: Supervision/safety;Sit to/from stand;Min guard     Tub/Shower Transfer Details (indicate cue type and reason): did not occur Functional mobility during ADLs: Min guard;Supervision/safety;Cueing for safety;Rolling walker General ADL Comments: Started education on use of BSC and AE to increase independence/safety and decrease pain with ADLs.     Pertinent Vitals/Pain Pain Assessment: Faces Faces Pain Scale: Hurts little more Pain Location: right hip with movement and mobility  Pain Descriptors / Indicators: Aching;Sore Pain Intervention(s): Monitored during session;Repositioned;Ice applied     Hand Dominance Right   Extremity/Trunk Assessment Upper Extremity Assessment Upper Extremity Assessment: Overall WFL for tasks assessed   Lower Extremity Assessment Lower Extremity Assessment: Defer to PT evaluation   Cervical / Trunk Assessment Cervical / Trunk Assessment: Normal   Communication Communication Communication: No difficulties   Cognition Arousal/Alertness: Awake/alert Behavior During Therapy: WFL for tasks assessed/performed Overall Cognitive Status: Within Functional Limits for tasks assessed              Home Living Family/patient expects to be discharged to:: Private residence Living Arrangements:  Other relatives Available Help at Discharge: Family;Available 24 hours/day Type of Home: Mobile home  Home Access: Stairs to enter Entrance Stairs-Number of Steps: 2 Entrance Stairs-Rails: Right;Left;Can reach both Home Layout: One level     Bathroom Shower/Tub: Walk-in Pensions consultantshower;Curtain   Bathroom Toilet: Standard     Home Equipment: None   Additional Comments: Pt reports she lives with her aunt and has a cousin who is an aide that can assist prn post acute d/c.      Prior Functioning/Environment Level of Independence: Independent     OT Diagnosis: Generalized weakness;Acute pain   OT Problem List: Decreased strength;Decreased range of motion;Decreased activity tolerance;Impaired balance (sitting and/or standing);Decreased safety awareness;Decreased knowledge of use of DME or AE;Decreased knowledge of precautions;Pain   OT Treatment/Interventions: Self-care/ADL training;Therapeutic exercise;Energy conservation;DME and/or AE instruction;Therapeutic activities;Patient/family education;Balance training    OT Goals(Current goals can be found in the care plan section) Acute Rehab OT Goals Patient Stated Goal: go home OT Goal Formulation: With patient Time For Goal Achievement: 05/31/15 Potential to Achieve Goals: Good ADL Goals Pt Will Perform Grooming: with modified independence;standing Pt Will Perform Lower Body Bathing: with modified independence;sit to/from stand;with adaptive equipment Pt Will Perform Lower Body Dressing: with modified independence;sit to/from stand;with adaptive equipment Pt Will Transfer to Toilet: with modified independence;ambulating;bedside commode Pt Will Perform Tub/Shower Transfer: Shower transfer;ambulating;rolling walker;with modified independence;3 in 1 Additional ADL Goal #1: Pt will be mod I with functional ambulation/mobility during ADL using LRAD  OT Frequency: Min 2X/week   Barriers to D/C: none known at this time    End of Session  Equipment Utilized During Treatment: Rolling walker Nurse Communication: Mobility status  Activity Tolerance: Patient tolerated treatment well Patient left: in chair;with call bell/phone within reach;with chair alarm set   Time: [id number] OT Time Calculation (min): 22 min Charges:  OT General Charges $OT Visit: 1 Procedure OT Evaluation $OT Eval Low Complexity: 1 Procedure  Edwin CapPatricia Slayton Lubitz , MS, OTR/L, CLT Pager: [phone]  05/17/2015, 11:17 AM

## 2015-05-17 NOTE — Progress Notes (Signed)
Subjective: 1 Day Post-Op Procedure(s) (LRB): RIGHT TOTAL HIP ARTHROPLASTY ANTERIOR APPROACH (Right) Patient reports pain as moderate.    Objective: Vital signs in last 24 hours: Temp:  [97.3 F (36.3 C)-98.5 F (36.9 C)] 98.5 F (36.9 C) (03/15 0629) Pulse Rate:  [46-97] 89 (03/15 0629) Resp:  [13-22] 15 (03/15 0629) BP: (95-130)/(48-88) 111/63 mmHg (03/15 0629) SpO2:  [95 %-100 %] 99 % (03/15 0629) Weight:  [87.544 kg (193 lb)] 87.544 kg (193 lb) (03/14 1650)  Intake/Output from previous day: 03/14 0701 - 03/15 0700 In: 2660 [P.O.:490; I.V.:2120; IV Piggyback:50] Out: 350 [Urine:250; Blood:100] Intake/Output this shift:     Recent Labs  05/17/15 0554  HGB 11.8*    Recent Labs  05/17/15 0554  WBC 6.5  RBC 3.75*  HCT 33.9*  PLT 173   No results for input(s): NA, K, CL, CO2, BUN, CREATININE, GLUCOSE, CALCIUM in the last 72 hours. No results for input(s): LABPT, INR in the last 72 hours.  Sensation intact distally Intact pulses distally Dorsiflexion/Plantar flexion intact Incision: scant drainage  Assessment/Plan: 1 Day Post-Op Procedure(s) (LRB): RIGHT TOTAL HIP ARTHROPLASTY ANTERIOR APPROACH (Right) Up with therapy - WBAT right hip  [name] Y 05/17/2015, 7:29 AM

## 2015-05-18 MED ORDER — METHOCARBAMOL 500 MG PO TABS: 500.0000 mg | ORAL_TABLET | Freq: Four times a day (QID) | ORAL | Status: AC | PRN

## 2015-05-18 MED ORDER — ASPIRIN 325 MG PO TBEC: 325.0000 mg | DELAYED_RELEASE_TABLET | Freq: Two times a day (BID) | ORAL | Status: AC

## 2015-05-18 MED ORDER — NICOTINE 14 MG/24HR TD PT24: 14.0000 mg | MEDICATED_PATCH | Freq: Every day | TRANSDERMAL | Status: AC

## 2015-05-18 MED ORDER — OXYCODONE-ACETAMINOPHEN 5-325 MG PO TABS: 1.0000 | ORAL_TABLET | ORAL | Status: AC | PRN

## 2015-05-18 NOTE — Progress Notes (Signed)
[name] to be D/C'd Home with HH per MD order.  Discussed with the patient and all questions fully answered.  VSS, Skin clean, dry and intact without evidence of skin break down, no evidence of skin tears noted. IV catheter discontinued intact. Site without signs and symptoms of complications. Dressing and pressure applied.  An After Visit Summary was printed and given to the patient. Patient received prescription.  D/c education completed with patient/family including follow up instructions, medication list, d/c activities limitations if indicated, with other d/c instructions as indicated by MD - patient able to verbalize understanding, all questions fully answered.   Patient instructed to return to ED, call 911, or call MD for any changes in condition.   Patient escorted via WC, and D/C home via private auto.  [name] 05/18/2015 10:29 AM

## 2015-05-18 NOTE — Progress Notes (Signed)
Physical Therapy Treatment Patient Details Name: [name] MRN: [id number] DOB: 1976-02-13 Today's Date: 05/18/2015    History of Present Illness [name], 40 y.o. female, has a history of pain and functional disability in the right hip. Pt s/p Right THA. PMH: HTN, arthritis     PT Comments    Pt performed increased gait distance and stair training with cues for sequencing for safety.  Reviewed HEP and pt verbalized and demonstrated understanding.    Follow Up Recommendations  Home health PT;Supervision - Intermittent     Equipment Recommendations  Rolling walker with 5" wheels    Recommendations for Other Services       Precautions / Restrictions Precautions Precautions: Fall Restrictions Weight Bearing Restrictions: No RLE Weight Bearing: Weight bearing as tolerated    Mobility  Bed Mobility Overal bed mobility: Modified Independent Bed Mobility: Supine to Sit;Sit to Supine     Supine to sit: Supervision Sit to supine: Supervision   General bed mobility comments: Pt able to manage RLE independently with cues for technique.    Transfers Overall transfer level: Needs assistance Equipment used: Rolling walker (2 wheeled) Transfers: Sit to/from Stand Sit to Stand: Supervision         General transfer comment: Pt initially stood without RW and supervision.  Gait belt applied in standing.  Cues for safety with RW present on next attempt.    Ambulation/Gait Ambulation/Gait assistance: Min guard Ambulation Distance (Feet): 250 Feet Assistive device: Rolling walker (2 wheeled) Gait Pattern/deviations: Step-through pattern;Decreased stride length;Decreased step length - left;Decreased stance time - right;Antalgic;Narrow base of support Gait velocity: redcued   General Gait Details: Pt progressed to step through gait pattern to simulate a more normalized gait pattern.  Pt required cues for gait symmetry and RW proximity.     Stairs             Wheelchair Mobility    Modified Rankin (Stroke Patients Only)       Balance Overall balance assessment: Needs assistance Sitting-balance support: Feet supported Sitting balance-Leahy Scale: Good       Standing balance-Leahy Scale: Good                      Cognition Arousal/Alertness: Awake/alert Behavior During Therapy: WFL for tasks assessed/performed Overall Cognitive Status: Within Functional Limits for tasks assessed                      Exercises Total Joint Exercises Ankle Circles/Pumps: AROM;10 reps;Right Quad Sets: AROM;Both;10 reps Gluteal Sets: AROM;Both;10 reps Heel Slides: AROM;10 reps;Right Hip ABduction/ADduction: AROM;Right;10 reps Long Arc Quad: AROM;Right;10 reps Knee Flexion: AROM;Right;10 reps Marching in Standing: AROM;Right;10 reps Standing Hip Extension: AROM;Right;10 reps Other Exercises Other Exercises: 1x10 R hamstring curls in standing.      General Comments        Pertinent Vitals/Pain Pain Score: 4  Pain Location: R hip Pain Descriptors / Indicators: Aching;Operative site guarding Pain Intervention(s): Premedicated before session;Repositioned    Home Living                      Prior Function            PT Goals (current goals can now be found in the care plan section) Acute Rehab PT Goals Patient Stated Goal: go home Potential to Achieve Goals: Good Progress towards PT goals: Progressing toward goals    Frequency  7X/week    PT Plan  Co-evaluation             End of Session Equipment Utilized During Treatment: Gait belt Activity Tolerance: Patient tolerated treatment well;Patient limited by fatigue Patient left: with call bell/phone within reach;in chair;with family/visitor present     Time: [id number] PT Time Calculation (min) (ACUTE ONLY): 31 min  Charges:  $Gait Training: 8-22 mins $Therapeutic Exercise: 8-22 mins                    G Codes:      Courtney Aversimee J  Jaris Silva 05/18/2015, 10:39 AM  [name], PTA pager [phone]

## 2015-05-18 NOTE — Discharge Instructions (Signed)

## 2015-05-18 NOTE — Discharge Summary (Signed)
Patient ID: Courtney CabotShuwana L Maybury MRN: [id number] DOB/AGE: 38/04/1975 37 y.o.  Admit date: 05/16/2015 Discharge date: 05/18/2015  Admission Diagnoses:  Principal Problem:   Osteoarthritis of right hip Active Problems:   Status post total replacement of right hip   Discharge Diagnoses:  Same  Past Medical History  Diagnosis Date  . Hypertension   . Arthritis     Surgeries: Procedure(s): RIGHT TOTAL HIP ARTHROPLASTY ANTERIOR APPROACH on 05/16/2015   Consultants:    Discharged Condition: Improved  Hospital Course: [name] is an 40 y.o. Silva who was admitted 05/16/2015 for operative treatment ofOsteoarthritis of right hip. Patient has severe unremitting pain that affects sleep, daily activities, and work/hobbies. After pre-op clearance the patient was taken to the operating room on 05/16/2015 and underwent  Procedure(s): RIGHT TOTAL HIP ARTHROPLASTY ANTERIOR APPROACH.    Patient was given perioperative antibiotics: Anti-infectives    Start     Dose/Rate Route Frequency Ordered Stop   05/16/15 1730  ceFAZolin (ANCEF) IVPB 1 g/50 mL premix     1 g 100 mL/hr over 30 Minutes Intravenous Every 6 hours 05/16/15 1651 05/17/15 0013   05/16/15 1000  ceFAZolin (ANCEF) IVPB 2 g/50 mL premix     2 g 100 mL/hr over 30 Minutes Intravenous To ShortStay Surgical 05/15/15 1325 05/16/15 1110       Patient was given sequential compression devices, early ambulation, and chemoprophylaxis to prevent DVT.  Patient benefited maximally from hospital stay and there were no complications.    Recent vital signs: Patient Vitals for the past 24 hrs:  BP Temp Temp src Pulse Resp SpO2  05/18/15 0554 124/76 mmHg 98.5 F (36.9 C) Oral 91 16 97 %  05/17/15 2238 115/66 mmHg 99.1 F (37.3 C) Oral 93 16 100 %  05/17/15 1314 134/80 mmHg 98.3 F (36.8 C) Oral 82 20 100 %  05/17/15 0900 (!) 122/59 mmHg - - 85 - -     Recent laboratory studies:  Recent Labs  05/17/15 0554  WBC 6.5  HGB 11.8*   HCT 33.9*  PLT 173  NA 134*  K 2.7*  CL 100*  CO2 25  BUN <5*  CREATININE 0.68  GLUCOSE 116*  CALCIUM 8.3*     Discharge Medications:     Medication List    TAKE these medications        aspirin 325 MG EC tablet  Take 1 tablet (325 mg total) by mouth 2 (two) times daily after a meal.     atenolol 25 MG tablet  Commonly known as:  TENORMIN  Take 25 mg by mouth daily.     hydrochlorothiazide 25 MG tablet  Commonly known as:  HYDRODIURIL  Take 25 mg by mouth daily.     lovastatin 20 MG tablet  Commonly known as:  MEVACOR  Take 20 mg by mouth at bedtime.     methocarbamol 500 MG tablet  Commonly known as:  ROBAXIN  Take 1 tablet (500 mg total) by mouth every 6 (six) hours as needed for muscle spasms.     nicotine 14 mg/24hr patch  Commonly known as:  NICODERM CQ - dosed in mg/24 hours  Place 1 patch (14 mg total) onto the skin daily.     oxyCODONE-acetaminophen 5-325 MG tablet  Commonly known as:  ROXICET  Take 1-2 tablets by mouth every 4 (four) hours as needed.     traMADol 50 MG tablet  Commonly known as:  ULTRAM  Take 50-100 mg by mouth every  8 (eight) hours as needed for moderate pain.        Diagnostic Studies: Dg Hip Port Unilat With Pelvis 1v Right  05/16/2015  CLINICAL DATA:  Postoperative right hip replacement. EXAM: DG HIP (WITH OR WITHOUT PELVIS) 1V PORT RIGHT COMPARISON:  None. FINDINGS: Total right hip replacement is identified without malalignment. Postsurgical changes including soft tissue air is noted in the right hip. IMPRESSION: Total right hip replacement is identified without malalignment. Electronically Signed   By: Sherian Rein M.D.   On: 05/16/2015 13:55   Dg Hip Operative Unilat W Or W/o Pelvis Right  05/16/2015  CLINICAL DATA:  Total right hip arthroplasty. EXAM: OPERATIVE choose 2 HIP (WITH PELVIS IF PERFORMED) 2 VIEWS TECHNIQUE: Fluoroscopic spot image(s) were submitted for interpretation post-operatively. COMPARISON:  07/04/2014  FINDINGS: AP views of the pelvis and right hip demonstrate expected appearance of right hip arthroplasty, without acute hardware complication or periprosthetic fracture. IMPRESSION: Expected appearance after right hip arthroplasty. Electronically Signed   By: Jeronimo Greaves M.D.   On: 05/16/2015 12:21    Disposition: to home      Discharge Instructions    Discharge patient    Complete by:  As directed            Follow-up Information    Follow up with [facility].   Why:  HH PT   Contact information:   [address] [place] [address] [phone]       Follow up with [facility].   Why:  RW to be delivered to room prior to discharge   Contact information:   [address] [place] [address] [phone]       Follow up with Kathryne Hitch, MD In 2 weeks.   Specialty:  Orthopedic Surgery   Contact information:   [address] [place] [address] [phone]        Signed: Kathryne Hitch 05/18/2015, 7:11 AM

## 2015-05-18 NOTE — Progress Notes (Signed)
Patient ID: [name], female   DOB: 03/02/1976, 40 y.o.   MRN: [id number] Doing well.  Can be discharged to home today.

## 2015-11-14 ENCOUNTER — Other Ambulatory Visit (HOSPITAL_COMMUNITY)
Admission: RE | Admit: 2015-11-14 | Discharge: 2015-11-14 | Disposition: A | Discharge status: Home / Self Care | Payer: PRIVATE HEALTH INSURANCE | Source: Ambulatory Visit | Attending: Unknown Physician Specialty | Admitting: Unknown Physician Specialty

## 2015-11-14 DIAGNOSIS — A630 Anogenital (venereal) warts: Secondary | ICD-10-CM | POA: Insufficient documentation

## 2016-02-01 ENCOUNTER — Ambulatory Visit (INDEPENDENT_AMBULATORY_CARE_PROVIDER_SITE_OTHER): Payer: Self-pay | Admitting: Orthopaedic Surgery

## 2016-02-06 ENCOUNTER — Encounter (INDEPENDENT_AMBULATORY_CARE_PROVIDER_SITE_OTHER): Payer: Self-pay | Admitting: Orthopaedic Surgery

## 2016-02-06 ENCOUNTER — Ambulatory Visit (INDEPENDENT_AMBULATORY_CARE_PROVIDER_SITE_OTHER): Payer: PRIVATE HEALTH INSURANCE | Admitting: Orthopaedic Surgery

## 2016-02-06 DIAGNOSIS — G8929 Other chronic pain: Secondary | ICD-10-CM

## 2016-02-06 DIAGNOSIS — Z96641 Presence of right artificial hip joint: Secondary | ICD-10-CM

## 2016-02-06 DIAGNOSIS — M545 Low back pain: Secondary | ICD-10-CM

## 2016-02-06 MED ORDER — METHYLPREDNISOLONE 4 MG PO TABS: ORAL_TABLET | ORAL | 0 refills | Status: AC

## 2016-02-06 MED ORDER — MELOXICAM 7.5 MG PO TABS: 7.5000 mg | ORAL_TABLET | Freq: Two times a day (BID) | ORAL | 0 refills | Status: AC | PRN

## 2016-02-06 MED ORDER — TIZANIDINE HCL 4 MG PO TABS: 4.0000 mg | ORAL_TABLET | Freq: Two times a day (BID) | ORAL | 0 refills | Status: AC | PRN

## 2016-02-06 NOTE — Progress Notes (Signed)
Office Visit Note   Patient: Courtney CabotShuwana L Scharfenberg           Date of Birth: 05/09/1975           MRN: [id number] Visit Date: 02/06/2016              Requested by: Verdell Faceochelle D. Muse, PA-C [address] [place], [place] [id number] PCP: MUSE,ROCHELLE D., PA-C   Assessment & Plan: Visit Diagnoses:  1. Presence of right artificial hip joint   2. Chronic left-sided low back pain without sciatica     Plan: I will send in a steroid taper followed by an anti-inflammatory and a muscle relaxant help with her back. I think as she gets out for the surgery she will feel better. I would like see her back in her 1 year follow-up in March. At that visit I would like a AP and lateral of her right  Follow-Up Instructions: Return in about 3 months (around 05/06/2016).   Orders:  No orders of the defined types were placed in this encounter.  No orders of the defined types were placed in this encounter.     Procedures: No procedures performed   Clinical Data: No additional findings.   Subjective: Chief Complaint  Patient presents with  . Right Hip - Routine Post Op   Reports that she is doing well other than some numbness around her incision which is to be expected 9 months out from surgery. At that has been getting less. She's been reporting some low back pain to the left side of her low back but there is no radicular components her sciatica that she reports. This is been managed and standing at work all day. HPI  Review of Systems Currently denies any chest pain, headache, shortness of breath, fever, chills, nausea, vomiting.  Objective: Vital Signs: There were no vitals taken for this visit.  Physical Exam He is alert and oriented 3. Ortho Exam Examination of her left and right hip show normal hip exam on both sides except for the operative side does have subjective numbness around her incision. Her incisions well-healed. There is no significant swelling or seroma. Her leg lengths  are equal. Examination of her lumbar spine shows pain in just the paraspinal muscles left side otherwise she has a normal lower extremity and back exam. Specialty Comments:  No specialty comments available.  Imaging: No results found.   PMFS History: Patient Active Problem List   Diagnosis Date Noted  . Presence of right artificial hip joint 02/06/2016  . Osteoarthritis of right hip 05/16/2015  . Status post total replacement of right hip 05/16/2015   Past Medical History:  Diagnosis Date  . Arthritis   . Hypertension     No family history on file.  Past Surgical History:  Procedure Laterality Date  . BREAST SURGERY  2014,2009   rt breast abscess(s)  . TOTAL HIP ARTHROPLASTY Right 05/16/2015   Procedure: RIGHT TOTAL HIP ARTHROPLASTY ANTERIOR APPROACH;  Surgeon: Kathryne Hitchhristopher Y Aayra Hornbaker, MD;  Location: [facility] OR;  Service: Orthopedics;  Laterality: Right;   Social History   Occupational History  . Not on file.   Social History Main Topics  . Smoking status: Current Every Day Smoker    Packs/day: 0.50    Years: 20.00    Types: Cigarettes  . Smokeless tobacco: Not on file  . Alcohol use No  . Drug use: No  . Sexual activity: Yes    Birth control/ protection: Implant

## 2016-05-07 ENCOUNTER — Ambulatory Visit (INDEPENDENT_AMBULATORY_CARE_PROVIDER_SITE_OTHER): Payer: PRIVATE HEALTH INSURANCE | Admitting: Orthopaedic Surgery

## 2016-05-07 ENCOUNTER — Ambulatory Visit (INDEPENDENT_AMBULATORY_CARE_PROVIDER_SITE_OTHER): Payer: Self-pay

## 2016-05-07 DIAGNOSIS — M25561 Pain in right knee: Secondary | ICD-10-CM

## 2016-05-07 DIAGNOSIS — Z96641 Presence of right artificial hip joint: Secondary | ICD-10-CM | POA: Diagnosis not present

## 2016-05-07 MED ORDER — METHYLPREDNISOLONE ACETATE 40 MG/ML IJ SUSP
40.0000 mg | INTRAMUSCULAR | Status: AC | PRN
  Administered 2016-05-07: 40 mg via INTRA_ARTICULAR

## 2016-05-07 NOTE — Progress Notes (Signed)
Office Visit Note   Patient: [name]           Date of Birth: 02-Jun-1975           MRN: [id number] Visit Date: 05/07/2016              Requested by: Verdell Face. Muse, PA-C [address] [place], [place] [address] PCP: MUSE,ROCHELLE D., PA-C   Assessment & Plan: Visit Diagnoses:  1. History of right hip replacement   2. Acute pain of right knee     Plan: I talked her about trying a steroid injection in her right knee and she is agreeable to this. She's had some pain in the back of her knee and she requests a steroid injection in hopes that helps. She tolerated this injection easily without difficulties. As far as her hip goes up to about things that would need to bring her back to the office if she is having any issues at all. I can always see her for her knee. If she continues to have mechanical symptoms of the knee is in order MRI. She'll otherwise follow up as needed.  Follow-Up Instructions: Return if symptoms worsen or fail to improve.   Orders:  Orders Placed This Encounter  Procedures  . Large Joint Injection/Arthrocentesis  . XR HIP UNILAT W OR W/O PELVIS 1V RIGHT   No orders of the defined types were placed in this encounter.     Procedures: Large Joint Inj Date/Time: 05/07/2016 9:16 AM Performed by: Kathryne Hitch Authorized by: Kathryne Hitch   Location:  Knee Site:  R knee Ultrasound Guidance: No   Fluoroscopic Guidance: No   Arthrogram: No   Medications:  40 mg methylPREDNISolone acetate 40 MG/ML     Clinical Data: No additional findings.   Subjective: No chief complaint on file. 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 She is doing wonderfully she states. She says her leg lengths are equal. She is not walking with any type of limp. She has no groin pain or hip pain at all. She has been having some right knee pain that she went to look at today. She says she feels like  it to the back of the knee and sometimes the knee catches on her. It doesn't truly locked but she has a catching sensation and feels occasionally like it's coming give way. It is moderately painful.  HPI  Review of Systems She currently denies any chest pain, shortness of breath, fever, chills, nausea, vomiting or headache  Objective: Vital Signs: There were no vitals taken for this visit.  Physical Exam She is alert and oriented 3 and in no acute distress Ortho Exam Examination of her right hip shows fluid active and passive range of motion with no pain at all. Her incisions well-healed. She is neurovascularly intact around the incision itself. Her leg lengths are equal. Examination of her right knee shows no effusion. There is no significant deformity in terms of malalignment. She's got good range of motion of that knee and there is some slight medial joint line tenderness. There is no significant patellofemoral crepitation either. The knee feels ligamentously stable. Specialty Comments:  No specialty comments available.  Imaging: Xr Hip Unilat W Or W/o Pelvis 1v Right  Result Date: 05/07/2016 An AP pelvis shows a well-seated implant from a right total hip replacement. There is no hip effusion. There is no, getting features. There  is no evidence of loosening.    PMFS History: Patient Active Problem List   Diagnosis Date Noted  . Presence of right artificial hip joint 02/06/2016  . Osteoarthritis of right hip 05/16/2015  . Status post total replacement of right hip 05/16/2015   Past Medical History:  Diagnosis Date  . Arthritis   . Hypertension     No family history on file.  Past Surgical History:  Procedure Laterality Date  . BREAST SURGERY  2014,2009   rt breast abscess(s)  . TOTAL HIP ARTHROPLASTY Right 05/16/2015   Procedure: RIGHT TOTAL HIP ARTHROPLASTY ANTERIOR APPROACH;  Surgeon: Kathryne Hitch, MD;  Location: [facility] OR;  Service: Orthopedics;  Laterality: Right;    Social History   Occupational History  . Not on file.   Social History Main Topics  . Smoking status: Current Every Day Smoker    Packs/day: 0.50    Years: 20.00    Types: Cigarettes  . Smokeless tobacco: Not on file  . Alcohol use No  . Drug use: No  . Sexual activity: Yes    Birth control/ protection: Implant

## 2017-09-09 ENCOUNTER — Other Ambulatory Visit (HOSPITAL_COMMUNITY)
Admission: RE | Admit: 2017-09-09 | Discharge: 2017-09-09 | Disposition: A | Discharge status: Home / Self Care | Payer: PRIVATE HEALTH INSURANCE | Source: Ambulatory Visit | Attending: Nurse Practitioner | Admitting: Nurse Practitioner

## 2017-09-09 DIAGNOSIS — N870 Mild cervical dysplasia: Secondary | ICD-10-CM | POA: Insufficient documentation

## 2018-05-12 ENCOUNTER — Other Ambulatory Visit (HOSPITAL_COMMUNITY): Payer: Self-pay | Admitting: Nurse Practitioner

## 2018-05-12 DIAGNOSIS — Z1231 Encounter for screening mammogram for malignant neoplasm of breast: Secondary | ICD-10-CM

## 2018-05-27 ENCOUNTER — Ambulatory Visit (HOSPITAL_COMMUNITY): Payer: PRIVATE HEALTH INSURANCE

## 2018-06-24 ENCOUNTER — Encounter (HOSPITAL_COMMUNITY): Payer: Self-pay

## 2018-06-24 ENCOUNTER — Ambulatory Visit (HOSPITAL_COMMUNITY): Payer: PRIVATE HEALTH INSURANCE

## 2018-07-29 ENCOUNTER — Other Ambulatory Visit: Payer: Self-pay

## 2018-07-29 ENCOUNTER — Ambulatory Visit (HOSPITAL_COMMUNITY)
Admission: RE | Admit: 2018-07-29 | Discharge: 2018-07-29 | Disposition: A | Discharge status: Home / Self Care | Payer: Self-pay | Source: Ambulatory Visit | Attending: Nurse Practitioner | Admitting: Nurse Practitioner

## 2018-07-29 DIAGNOSIS — Z1231 Encounter for screening mammogram for malignant neoplasm of breast: Secondary | ICD-10-CM | POA: Insufficient documentation

## 2020-01-10 ENCOUNTER — Ambulatory Visit: Payer: Self-pay | Admitting: Orthopaedic Surgery

## 2020-01-17 ENCOUNTER — Ambulatory Visit (INDEPENDENT_AMBULATORY_CARE_PROVIDER_SITE_OTHER): Payer: Self-pay

## 2020-01-17 ENCOUNTER — Encounter: Payer: Self-pay | Admitting: Orthopaedic Surgery

## 2020-01-17 ENCOUNTER — Ambulatory Visit (INDEPENDENT_AMBULATORY_CARE_PROVIDER_SITE_OTHER): Payer: Self-pay | Admitting: Orthopaedic Surgery

## 2020-01-17 DIAGNOSIS — M545 Low back pain, unspecified: Secondary | ICD-10-CM | POA: Diagnosis not present

## 2020-01-17 DIAGNOSIS — M25552 Pain in left hip: Secondary | ICD-10-CM

## 2020-01-17 DIAGNOSIS — M25571 Pain in right ankle and joints of right foot: Secondary | ICD-10-CM | POA: Diagnosis not present

## 2020-01-17 MED ORDER — NABUMETONE 500 MG PO TABS: 500.0000 mg | ORAL_TABLET | Freq: Two times a day (BID) | ORAL | 1 refills | Status: AC | PRN

## 2020-01-17 NOTE — Progress Notes (Signed)
Office Visit Note   Patient: [name]           Date of Birth: Mar 19, 1975           MRN: [id number] Visit Date: 01/17/2020              Requested by: Kizzie Furnish D., PA-C [address] [place],  [place] [address] PCP: Tylene Fantasia., PA-C   Assessment & Plan: Visit Diagnoses:  1. Pain in left hip   2. Low back pain, unspecified back pain laterality, unspecified chronicity, unspecified whether sciatica present   3. Pain in right ankle and joints of right foot     Plan: I would like to put her on Relafen as an anti-inflammatory.  Also would like to send her to outpatient physical therapy to work on her low back to the left side as well as her left hip and her right ankle.  We will try ASO for the ankle as well.  All questions and concerns were answered addressed.  We will see her back in about 6 weeks to see how she is doing overall.  Follow-Up Instructions: No follow-ups on file.   Orders:  Orders Placed This Encounter  Procedures  . XR HIP UNILAT W OR W/O PELVIS 2-3 VIEWS LEFT  . XR Ankle Complete Right  . XR Lumbar Spine 2-3 Views   Meds ordered this encounter  Medications  . nabumetone (RELAFEN) 500 MG tablet    Sig: Take 1 tablet (500 mg total) by mouth 2 (two) times daily as needed.    Dispense:  60 tablet    Refill:  1      Procedures: No procedures performed   Clinical Data: No additional findings.   Subjective: Chief Complaint  Patient presents with  . Left Hip - Pain  The patient comes in today for evaluation treatment of low back pain and left hip pain as well as right ankle pain.  She said the pain she is noticed about 3 months ago.  She was started on Flexeril by her primary care physician.  She has some pain over the trochanteric area of her left hip but also mainly of her low back to the left side.  She denies any weakness in her left lower extremity and denies any groin pain.  She says that is mainly on the side.  She denies any  numbness tingling in her leg on the left side.  We replaced her right hip in 2017.  She has been having medial ankle pain on the right ankle and she points to the posterior tibial tendon area.  She does have flat feet.  She has had no other acute change in her medical status.  She is not a diabetic.  She has not had any physical therapy either.  HPI  Review of Systems She currently denies any headache, chest pain, shortness of breath, fever, chills, nausea, vomiting  Objective: Vital Signs: There were no vitals taken for this visit.  Physical Exam She is alert and orient x3 and in no acute distress Ortho Exam Examination of her left hip shows it moves smoothly and fluidly with no pain in the groin at all.  She has 5 out of 5 strength of her left lower extremity and normal sensation in all dermatomes.  Her reflexes are normal.  Her right hip also moves normally.  She is able to go up on her toes easily on both sides.  She has  pain over the course the posterior tibial tendon.  Her Janee Morn test on the right side is negative and the right ankle is ligamentously stable.  There is some pain along the IT band and the proximal trochanteric area on that left side. Specialty Comments:  No specialty comments available.  Imaging: XR HIP UNILAT W OR W/O PELVIS 2-3 VIEWS LEFT  Result Date: 01/17/2020 An AP pelvis and lateral left hip shows a normal-appearing left hip.  There is a previous right total hip arthroplasty with no acute findings.  XR Ankle Complete Right  Result Date: 01/17/2020 3 views of the right ankle show no acute findings.  The ankle mortise is intact and ankle joint is intact.  There is loss of Boehler's angle with a flatfoot deformity.  XR Lumbar Spine 2-3 Views  Result Date: 01/17/2020 2 views of the lumbar spine show no acute findings.  The alignment is well-maintained and the disc heights are well-maintained.    PMFS History: Patient Active Problem List   Diagnosis Date  Noted  . Presence of right artificial hip joint 02/06/2016  . Osteoarthritis of right hip 05/16/2015  . Status post total replacement of right hip 05/16/2015   Past Medical History:  Diagnosis Date  . Arthritis   . Hypertension     History reviewed. No pertinent family history.  Past Surgical History:  Procedure Laterality Date  . BREAST SURGERY  2014,2009   rt breast abscess(s)  . TOTAL HIP ARTHROPLASTY Right 05/16/2015   Procedure: RIGHT TOTAL HIP ARTHROPLASTY ANTERIOR APPROACH;  Surgeon: Kathryne Hitch, MD;  Location: [facility] OR;  Service: Orthopedics;  Laterality: Right;   Social History   Occupational History  . Not on file  Tobacco Use  . Smoking status: Current Every Day Smoker    Packs/day: 0.50    Years: 20.00    Pack years: 10.00    Types: Cigarettes  Substance and Sexual Activity  . Alcohol use: No  . Drug use: No  . Sexual activity: Yes    Birth control/protection: Implant

## 2020-02-28 ENCOUNTER — Ambulatory Visit: Payer: Medicaid - Out of State | Admitting: Orthopaedic Surgery

## 2020-08-03 IMAGING — MG DIGITAL SCREENING BILATERAL MAMMOGRAM WITH TOMO AND CAD
8 series · 8 of 24 positions shown · non-contrast
Comparison: Previous exam(s).

CLINICAL DATA: Screening.

EXAM:
DIGITAL SCREENING BILATERAL MAMMOGRAM WITH TOMO AND CAD

[R CC synth-2D]
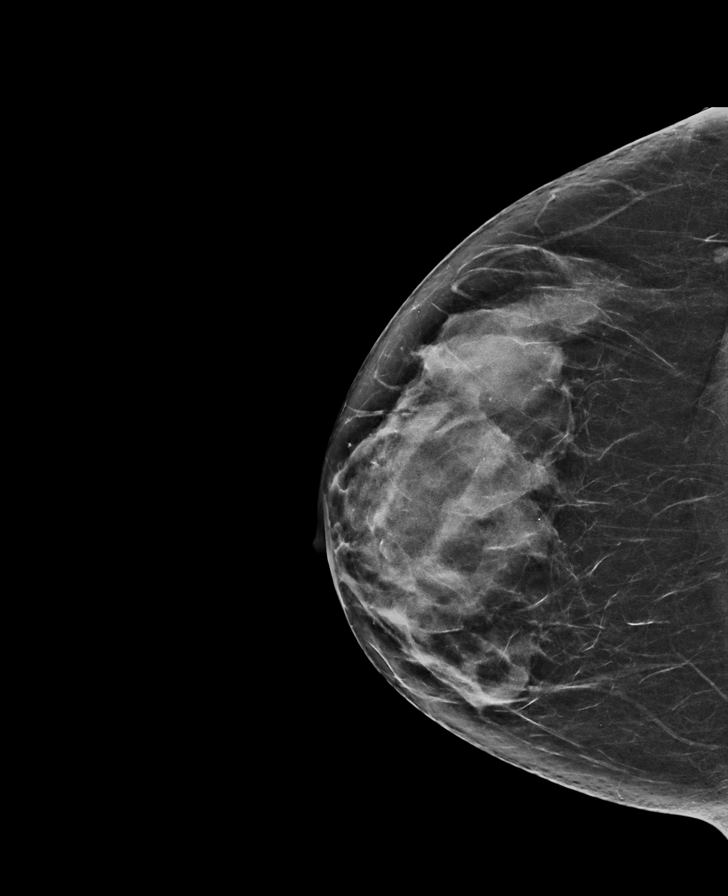

[L MLO synth-2D]
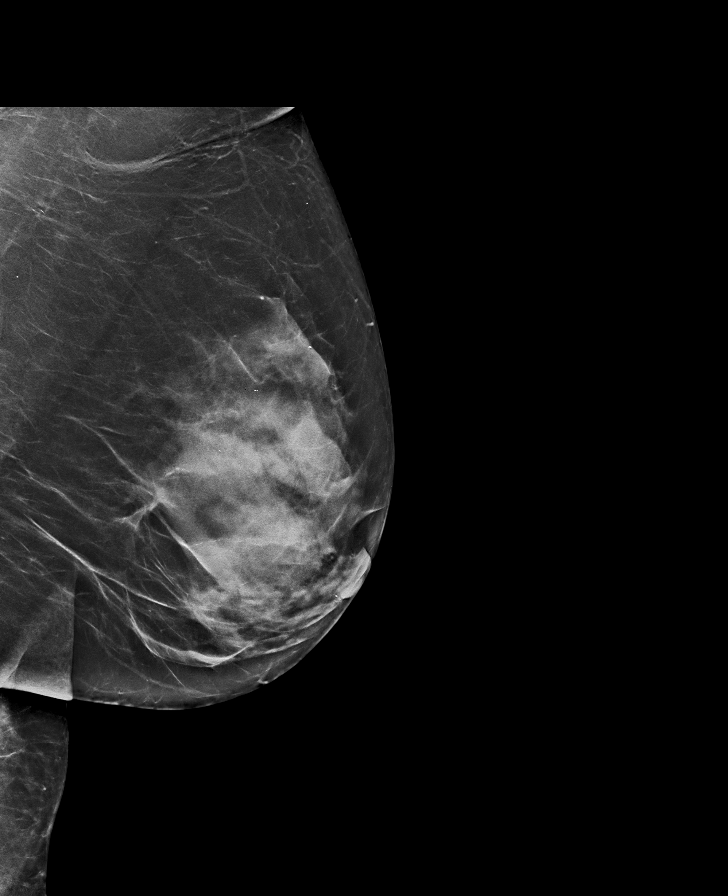

[R MLO synth-2D]
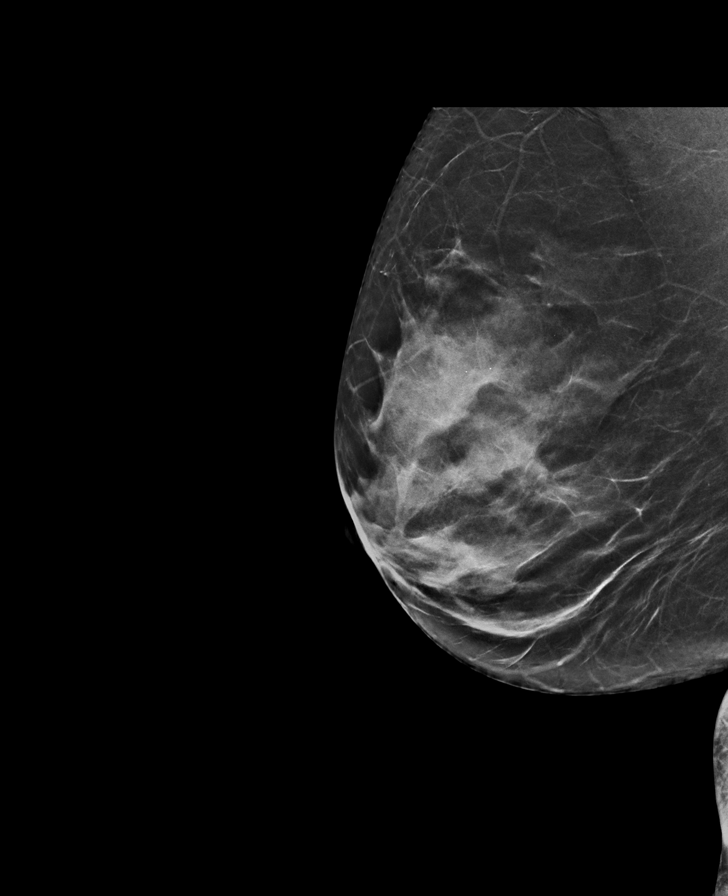

[L CC synth-2D]
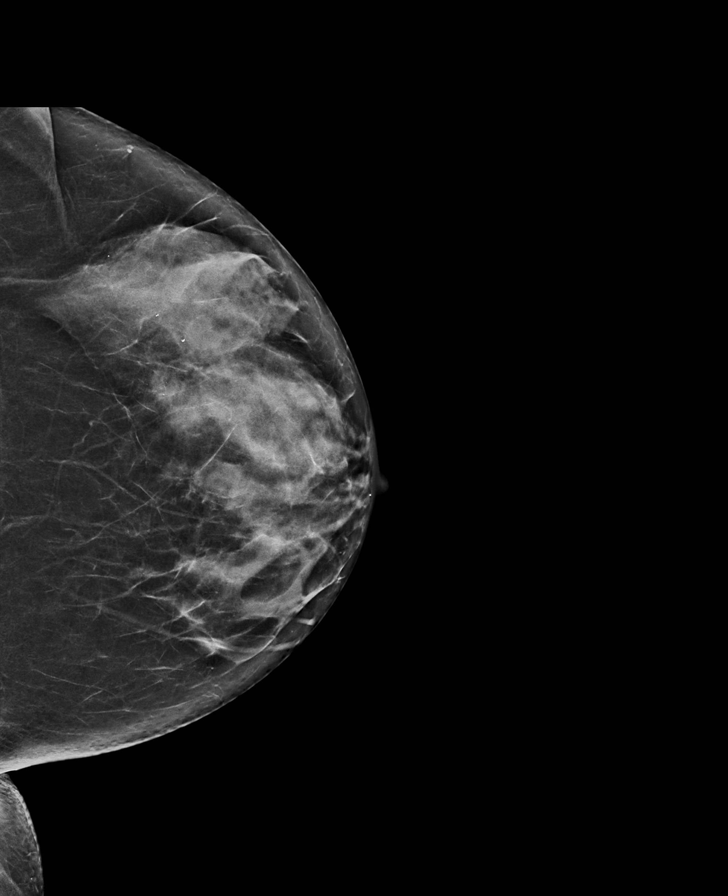

[L CC tomo · tomo slice 33/66.0]
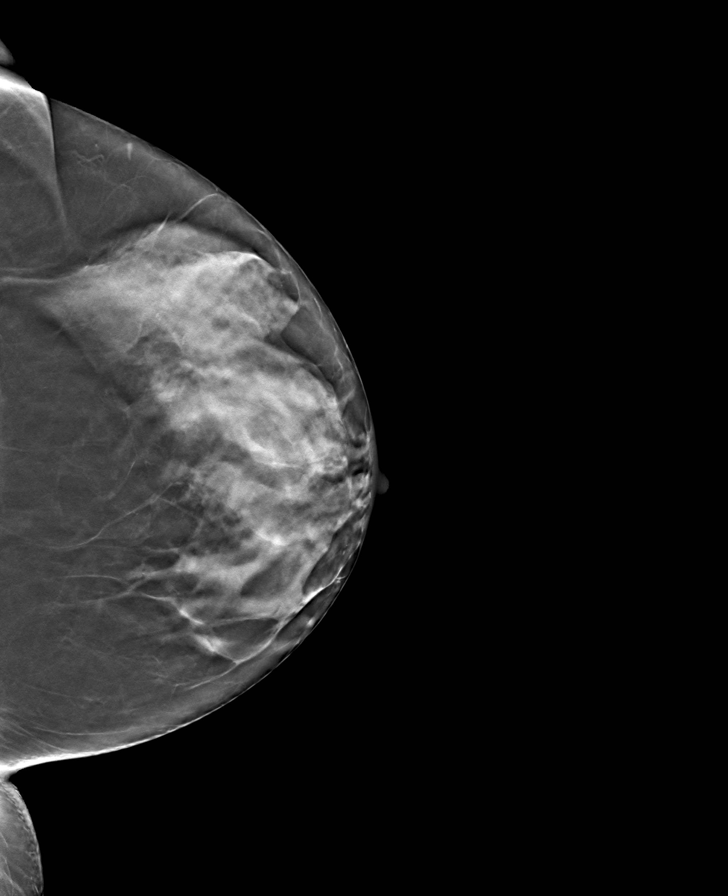

[L MLO tomo · tomo slice 36/71.0]
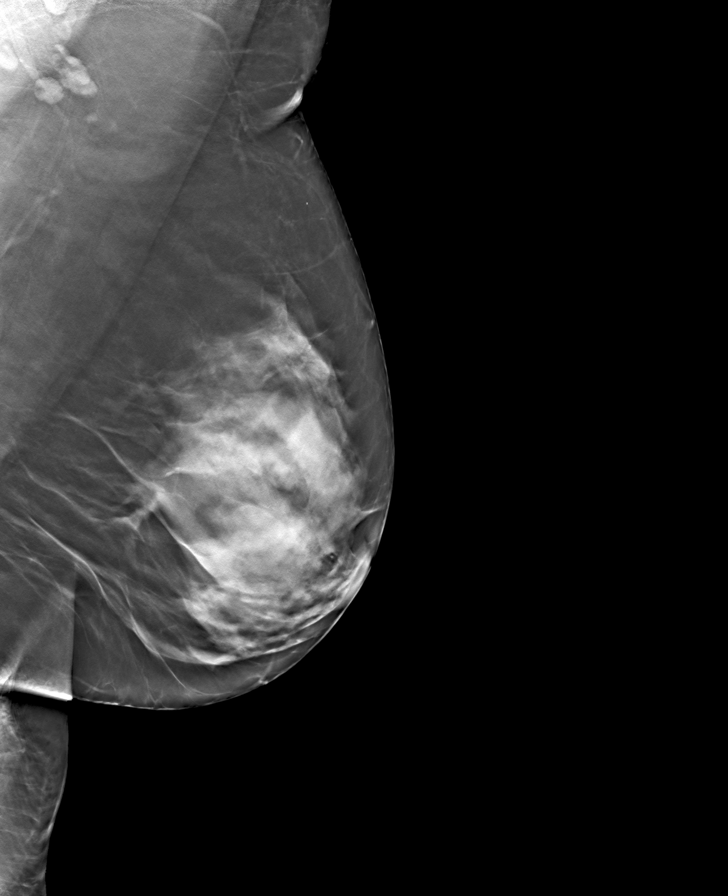

[R MLO tomo · tomo slice 32/63.0]
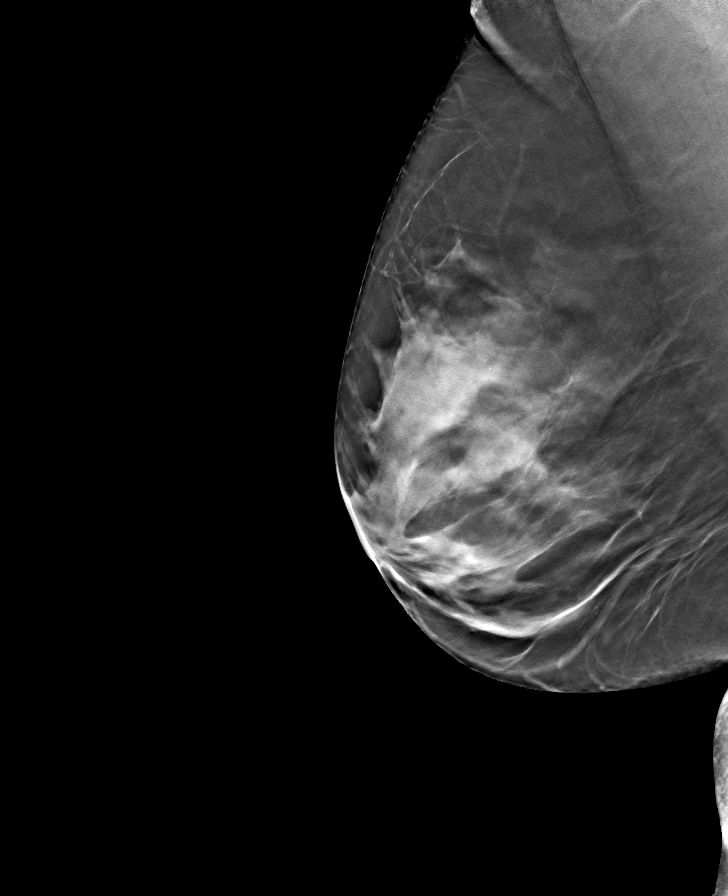

[R CC tomo · tomo slice 32/63.0]
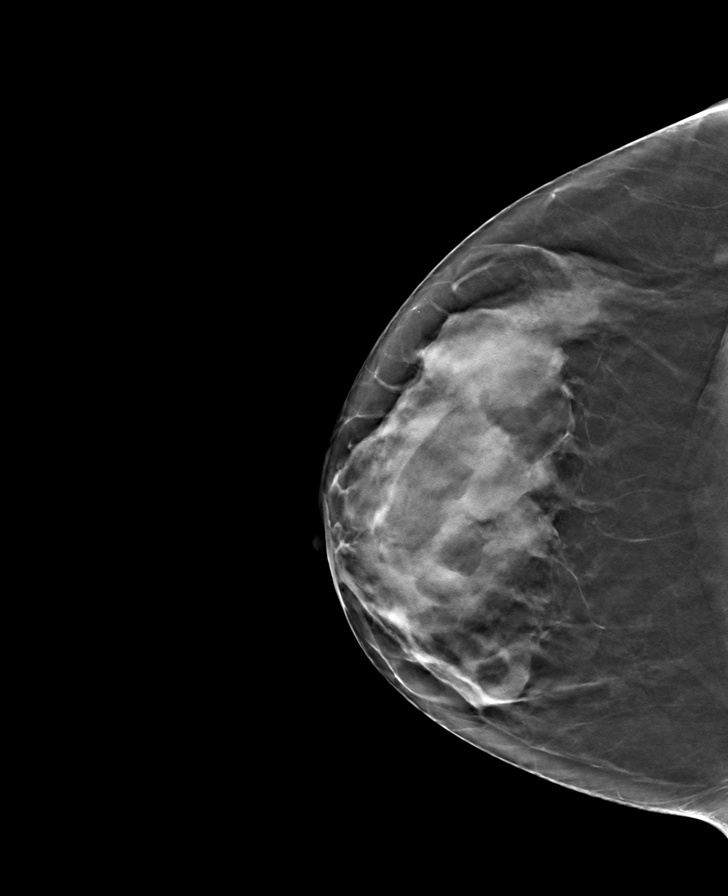

[8 of 24 positions shown; findings below may reference images not displayed]

ACR Breast Density Category c: The breast tissue is heterogeneously
dense, which may obscure small masses.
FINDINGS: There are no findings suspicious for malignancy. Images were
processed with CAD.
IMPRESSION: No mammographic evidence of malignancy. A result letter of this
screening mammogram will be mailed directly to the patient.

RECOMMENDATION:
Screening mammogram in one year. (Code:FT-U-LHB)

BI-RADS CATEGORY  1: Negative.
